# Patient Record
Sex: Female | Born: 1965 | Race: White | Hispanic: No | Marital: Married | Smoking: Never smoker
Health system: Southern US, Community
[De-identification: ages and names within clinical notes are randomized; demographics above are authoritative.]

## PROBLEM LIST (undated history)

## (undated) DIAGNOSIS — Z8489 Family history of other specified conditions: Secondary | ICD-10-CM

## (undated) DIAGNOSIS — F419 Anxiety disorder, unspecified: Secondary | ICD-10-CM

## (undated) DIAGNOSIS — I10 Essential (primary) hypertension: Secondary | ICD-10-CM

## (undated) DIAGNOSIS — Z9889 Other specified postprocedural states: Secondary | ICD-10-CM

## (undated) DIAGNOSIS — F32A Depression, unspecified: Secondary | ICD-10-CM

## (undated) DIAGNOSIS — D649 Anemia, unspecified: Secondary | ICD-10-CM

## (undated) DIAGNOSIS — J309 Allergic rhinitis, unspecified: Secondary | ICD-10-CM

## (undated) DIAGNOSIS — R519 Headache, unspecified: Secondary | ICD-10-CM

## (undated) DIAGNOSIS — G2581 Restless legs syndrome: Secondary | ICD-10-CM

## (undated) DIAGNOSIS — S83207A Unspecified tear of unspecified meniscus, current injury, left knee, initial encounter: Secondary | ICD-10-CM

## (undated) DIAGNOSIS — Z8719 Personal history of other diseases of the digestive system: Secondary | ICD-10-CM

## (undated) DIAGNOSIS — R112 Nausea with vomiting, unspecified: Secondary | ICD-10-CM

## (undated) DIAGNOSIS — E785 Hyperlipidemia, unspecified: Secondary | ICD-10-CM

## (undated) DIAGNOSIS — K219 Gastro-esophageal reflux disease without esophagitis: Secondary | ICD-10-CM

## (undated) DIAGNOSIS — K589 Irritable bowel syndrome without diarrhea: Secondary | ICD-10-CM

## (undated) DIAGNOSIS — K5790 Diverticulosis of intestine, part unspecified, without perforation or abscess without bleeding: Secondary | ICD-10-CM

## (undated) DIAGNOSIS — Z973 Presence of spectacles and contact lenses: Secondary | ICD-10-CM

## (undated) DIAGNOSIS — S83512A Sprain of anterior cruciate ligament of left knee, initial encounter: Secondary | ICD-10-CM

## (undated) DIAGNOSIS — J45909 Unspecified asthma, uncomplicated: Secondary | ICD-10-CM

## (undated) HISTORY — DX: Depression, unspecified: F32.A

## (undated) HISTORY — DX: Restless legs syndrome: G25.81

## (undated) HISTORY — DX: Hyperlipidemia, unspecified: E78.5

## (undated) HISTORY — PX: OTHER SURGICAL HISTORY: SHX169

## (undated) HISTORY — PX: TONSILLECTOMY: SUR1361

## (undated) HISTORY — DX: Diverticulosis of intestine, part unspecified, without perforation or abscess without bleeding: K57.90

## (undated) HISTORY — PX: COLONOSCOPY WITH ESOPHAGOGASTRODUODENOSCOPY (EGD): SHX5779

## (undated) HISTORY — DX: Anxiety disorder, unspecified: F41.9

## (undated) HISTORY — DX: Irritable bowel syndrome, unspecified: K58.9

## (undated) HISTORY — DX: Allergic rhinitis, unspecified: J30.9

---

## 1998-12-30 ENCOUNTER — Emergency Department (HOSPITAL_COMMUNITY): Admission: EM | Admit: 1998-12-30 | Discharge: 1998-12-30 | Payer: Self-pay | Admitting: Emergency Medicine

## 1998-12-30 ENCOUNTER — Encounter: Payer: Self-pay | Admitting: Emergency Medicine

## 2000-06-15 ENCOUNTER — Other Ambulatory Visit: Admission: RE | Admit: 2000-06-15 | Discharge: 2000-06-15 | Payer: Self-pay | Admitting: Family Medicine

## 2001-05-20 ENCOUNTER — Encounter: Admission: RE | Admit: 2001-05-20 | Discharge: 2001-05-20 | Payer: Self-pay | Admitting: Family Medicine

## 2001-05-20 ENCOUNTER — Encounter: Payer: Self-pay | Admitting: Family Medicine

## 2004-04-23 ENCOUNTER — Encounter: Admission: RE | Admit: 2004-04-23 | Discharge: 2004-04-23 | Payer: Self-pay | Admitting: Family Medicine

## 2004-07-03 ENCOUNTER — Other Ambulatory Visit: Admission: RE | Admit: 2004-07-03 | Discharge: 2004-07-03 | Payer: Self-pay | Admitting: Family Medicine

## 2006-06-15 ENCOUNTER — Other Ambulatory Visit: Admission: RE | Admit: 2006-06-15 | Discharge: 2006-06-15 | Payer: Self-pay | Admitting: Family Medicine

## 2007-05-20 ENCOUNTER — Ambulatory Visit (HOSPITAL_COMMUNITY): Admission: RE | Admit: 2007-05-20 | Discharge: 2007-05-20 | Payer: Self-pay | Admitting: Obstetrics and Gynecology

## 2007-05-20 ENCOUNTER — Encounter (INDEPENDENT_AMBULATORY_CARE_PROVIDER_SITE_OTHER): Payer: Self-pay | Admitting: Obstetrics and Gynecology

## 2009-09-07 ENCOUNTER — Encounter: Admission: RE | Admit: 2009-09-07 | Discharge: 2009-09-07 | Payer: Self-pay | Admitting: Family Medicine

## 2009-09-11 ENCOUNTER — Encounter: Admission: RE | Admit: 2009-09-11 | Discharge: 2009-09-11 | Payer: Self-pay | Admitting: Family Medicine

## 2010-08-06 NOTE — Op Note (Signed)
NAMELAYAN, ZALENSKI              ACCOUNT NO.:  [id number]   MEDICAL RECORD NO.:  [id number]          PATIENT TYPE:  AMB   LOCATION:  SDC                           FACILITY:  [facility]   PHYSICIAN:  Gerald Leitz, MD          DATE OF BIRTH:  Feb 24, 1966   DATE OF PROCEDURE:  05/20/2007  DATE OF DISCHARGE:                               OPERATIVE REPORT   PREOPERATIVE DIAGNOSIS:  Menorrhagia.   POSTOPERATIVE DIAGNOSIS:  Menorrhagia.   PROCEDURE:  Hysteroscopy, dilation and curettage, NovaSure endometrial  ablation.   SURGEON:  Gerald Leitz, M.D.   ASSISTANT:  None.   ANESTHESIA:  General.   FINDINGS:  Normal appearing endometrial cavity.  No masses noted.   SPECIMEN:  Endometrial curettings.   DISPOSITION OF SPECIMEN:  To pathology.   ESTIMATED BLOOD LOSS:  Minimal.   COMPLICATIONS:  None.   DISTENTION MEDIUM DEFICIT:  45 mL.   INDICATIONS:  45 year old with menorrhagia who has failed hormonal  therapy and desired treatment with endometrial ablation.   PROCEDURE IN DETAIL:  Informed consent was obtained. The patient was  taken to the operating room where she was placed under general  anesthesia.  She was prepped and draped in the usual sterile fashion.  A  bivalve speculum was placed into the vaginal vault.  The cervix was  grasped anteriorly with a single tooth tenaculum.  It was then sounded  to 8.5 cm.  The endocervical length was estimated at 3.5 cm.  The  hysteroscope was inserted.  The patient was noted to have clot in the  endometrial cavity.  No masses were appreciated. The hysteroscope was  removed and sharp curettage was performed. The cervix was dilated to 8  mm and then the  NovaSure ablation apparatus was set at a cavity length  of 5 cm.  It was then inserted into the endometrial cavity to a depth of  8.5 cm. The NovaSure array was deployed. Cavity assessment test was  performed and passed. The ablation was performed at a power of 107 for 1  minute and 40 seconds.   Once ablation was complete, the array was  withdrawn into the NovaSure apparatus and the NovaSure device was  removed. The hysteroscope was then reinserted.  The endometrial cavity  appeared adequately ablated.  There was no evidence of uterine  perforation.  A paracervical block for postop anesthesia was performed  with 10 mL of 0.25% Marcaine injected, 5 mL at the 5 o'clock and 7  o'clock positions.  The single tooth tenaculum was removed from the anterior lip of the  cervix. Excellent hemostasis was noted.  The patient was awakened from  anesthesia and taken to the recovery room awake and in stable condition.  Sponge, lap, and needle counts were correct x2.      Gerald Leitz, MD  Electronically Signed     TC/MEDQ  D:  05/20/2007  T:  05/20/2007  Job:  [id number]

## 2010-08-06 NOTE — H&P (Signed)
Paige Alvarez, Paige Alvarez              ACCOUNT NO.:  [id number]   MEDICAL RECORD NO.:  [id number]          PATIENT TYPE:  AMB   LOCATION:  SDC                           [facility]ILITY:  [facility]   PHYSICIAN:  Gerald Leitz, MD          DATE OF BIRTH:  20-Sep-1965   DATE OF ADMISSION:  04/14/2007  DATE OF DISCHARGE:  04/14/2007                              HISTORY & PHYSICAL   Patient scheduled for surgery on May 20, 2007.   HISTORY OF PRESENT ILLNESS:  This is a 45 year old G2, P2 with  menorrhagia uncontrolled with hormonal management.  Patient has been on  Provera with continued menstruation since March 29, 2007.   PAST OB HISTORY:  Spontaneous vaginal delivery x2.   GYN HISTORY:  Her husband has had a vasectomy.  Cycle interval was  irregular.  No history of sexually transmitted diseases.  Last Pap smear  March 2008, this was normal.  No history of abnormal Pap smears.   PAST MEDICAL HISTORY:  1. Seasonal allergies.  2. Hypertension.  3. Hypercholesterolemia.  4. Morbid obesity.   PAST SURGICAL HISTORY:  Tonsillectomy.   MEDICATIONS:  1. Singulair.  2. Allegra.  3. Lipitor.  4. Lisinopril.   ALLERGIES:  PERCOCET CAUSES GI UPSET.   SOCIAL HISTORY:  Patient is married.  She denies tobacco use.  Occasional alcohol use.  No illicit drug use.   FAMILY HISTORY:  Negative for breast, ovarian, and colon cancer.   REVIEW OF SYSTEMS:  Positive for chronic cough, otherwise negative as  stated in history of current illness.   PHYSICAL EXAM:  VITAL SIGNS:  Blood pressure 132/82.  Weight 230.5  pounds.  Height 64-3/4 inches.  CARDIOVASCULAR:  Regular rate and rhythm.  LUNGS:  Clear to auscultation bilaterally.  ABDOMEN:  Soft, nontender, and nondistended.  No masses palpated.  Normal external female genitalia.  No vulvar, vaginal  or cervical  lesions noted.  A small amount of blood in the vaginal vault.  Bimanual  exam, no masses.  No cervical motion tenderness.   Ultrasound performed  May 11, 2007, shows the uterus to measure 9.2  cm in length.  AP diameter is 5.0 cm, width is 5.7 cm.  Endometrial  thickness 12.9 cm.   IMPRESSION AND PLAN:  A 45 year old with menorrhagia, failed hormonal  therapy, desires intervention with hysteroscopy, D and C, and  endometrial ablation.  The risks, benefits, and alternatives of surgery  were discussed with the patient including but not limited to infection,  bleeding, possible uterine perforation with need for further surgery.  Patient voiced understanding and desires to proceed with hysteroscopy, D  and C, and endometrial ablation.      Gerald Leitz, MD  Electronically Signed     TC/MEDQ  D:  05/18/2007  T:  05/19/2007  Job:  [id number]

## 2010-09-26 ENCOUNTER — Other Ambulatory Visit: Payer: Self-pay | Admitting: Family Medicine

## 2010-09-26 DIAGNOSIS — Z1231 Encounter for screening mammogram for malignant neoplasm of breast: Secondary | ICD-10-CM

## 2010-10-03 ENCOUNTER — Ambulatory Visit: Payer: Self-pay

## 2010-10-08 ENCOUNTER — Ambulatory Visit
Admission: RE | Admit: 2010-10-08 | Discharge: 2010-10-08 | Disposition: A | Discharge status: Home / Self Care | Payer: BC Managed Care – PPO | Source: Ambulatory Visit | Attending: Family Medicine | Admitting: Family Medicine

## 2010-10-08 DIAGNOSIS — Z1231 Encounter for screening mammogram for malignant neoplasm of breast: Secondary | ICD-10-CM

## 2010-12-13 LAB — CBC
MCV: 85.1
Platelets: 308
RDW: 13.8
WBC: 6.8

## 2010-12-13 LAB — URINALYSIS, DIPSTICK ONLY
Nitrite: NEGATIVE
Specific Gravity, Urine: 1.01
Urobilinogen, UA: 0.2
pH: 6.5

## 2010-12-13 LAB — BASIC METABOLIC PANEL
BUN: 5 — ABNORMAL LOW
Chloride: 102
Creatinine, Ser: 0.61

## 2010-12-13 LAB — PREGNANCY, URINE: Preg Test, Ur: NEGATIVE

## 2011-05-01 ENCOUNTER — Ambulatory Visit: Payer: Self-pay | Admitting: Bariatrics

## 2011-05-01 LAB — CBC WITH DIFFERENTIAL/PLATELET
Basophil #: 0 10*3/uL (ref 0.0–0.1)
Basophil %: 0.4 %
Eosinophil #: 0.1 10*3/uL (ref 0.0–0.7)
HCT: 41.3 % (ref 35.0–47.0)
HGB: 13.8 g/dL (ref 12.0–16.0)
Lymphocyte #: 2.1 10*3/uL (ref 1.0–3.6)
Lymphocyte %: 32.7 %
MCHC: 33.3 g/dL (ref 32.0–36.0)
Monocyte #: 0.4 10*3/uL (ref 0.0–0.7)
Monocyte %: 6.3 %
Neutrophil %: 58.4 %
WBC: 6.3 10*3/uL (ref 3.6–11.0)

## 2011-05-01 LAB — PROTIME-INR: Prothrombin Time: 12.9 secs (ref 11.5–14.7)

## 2011-05-01 LAB — COMPREHENSIVE METABOLIC PANEL
Albumin: 3.9 g/dL (ref 3.4–5.0)
Alkaline Phosphatase: 58 U/L (ref 50–136)
BUN: 9 mg/dL (ref 7–18)
Bilirubin,Total: 0.6 mg/dL (ref 0.2–1.0)
Creatinine: 0.54 mg/dL — ABNORMAL LOW (ref 0.60–1.30)
Glucose: 96 mg/dL (ref 65–99)
Osmolality: 284 (ref 275–301)
Potassium: 3.4 mmol/L — ABNORMAL LOW (ref 3.5–5.1)
Sodium: 143 mmol/L (ref 136–145)
Total Protein: 7.2 g/dL (ref 6.4–8.2)

## 2011-05-01 LAB — LIPASE, BLOOD: Lipase: 105 U/L (ref 73–393)

## 2011-05-01 LAB — APTT: Activated PTT: 33.1 secs (ref 23.6–35.9)

## 2011-05-01 LAB — MAGNESIUM: Magnesium: 1.9 mg/dL

## 2011-05-01 LAB — TSH: Thyroid Stimulating Horm: 1.39 u[IU]/mL

## 2011-05-01 LAB — AMYLASE: Amylase: 34 U/L (ref 25–115)

## 2011-05-12 ENCOUNTER — Ambulatory Visit: Payer: Self-pay | Admitting: Bariatrics

## 2011-05-23 ENCOUNTER — Ambulatory Visit: Payer: Self-pay | Admitting: Bariatrics

## 2011-09-29 ENCOUNTER — Other Ambulatory Visit: Payer: Self-pay | Admitting: Physician Assistant

## 2011-09-29 ENCOUNTER — Other Ambulatory Visit (HOSPITAL_COMMUNITY)
Admission: RE | Admit: 2011-09-29 | Discharge: 2011-09-29 | Disposition: A | Discharge status: Home / Self Care | Payer: BC Managed Care – PPO | Source: Ambulatory Visit | Attending: Family Medicine | Admitting: Family Medicine

## 2011-09-29 ENCOUNTER — Other Ambulatory Visit: Payer: Self-pay | Admitting: Family Medicine

## 2011-09-29 DIAGNOSIS — Z1231 Encounter for screening mammogram for malignant neoplasm of breast: Secondary | ICD-10-CM

## 2011-09-29 DIAGNOSIS — Z124 Encounter for screening for malignant neoplasm of cervix: Secondary | ICD-10-CM | POA: Insufficient documentation

## 2011-10-16 ENCOUNTER — Ambulatory Visit
Admission: RE | Admit: 2011-10-16 | Discharge: 2011-10-16 | Disposition: A | Discharge status: Home / Self Care | Payer: BC Managed Care – PPO | Source: Ambulatory Visit | Attending: Family Medicine | Admitting: Family Medicine

## 2011-10-16 DIAGNOSIS — Z1231 Encounter for screening mammogram for malignant neoplasm of breast: Secondary | ICD-10-CM

## 2012-09-28 ENCOUNTER — Other Ambulatory Visit: Payer: Self-pay

## 2012-09-28 DIAGNOSIS — Z1231 Encounter for screening mammogram for malignant neoplasm of breast: Secondary | ICD-10-CM

## 2012-10-26 ENCOUNTER — Ambulatory Visit
Admission: RE | Admit: 2012-10-26 | Discharge: 2012-10-26 | Disposition: A | Discharge status: Home / Self Care | Payer: BC Managed Care – PPO | Source: Ambulatory Visit

## 2012-10-26 DIAGNOSIS — Z1231 Encounter for screening mammogram for malignant neoplasm of breast: Secondary | ICD-10-CM

## 2012-10-27 ENCOUNTER — Other Ambulatory Visit: Payer: Self-pay | Admitting: Family Medicine

## 2012-10-27 DIAGNOSIS — R928 Other abnormal and inconclusive findings on diagnostic imaging of breast: Secondary | ICD-10-CM

## 2012-11-11 ENCOUNTER — Ambulatory Visit
Admission: RE | Admit: 2012-11-11 | Discharge: 2012-11-11 | Disposition: A | Discharge status: Home / Self Care | Payer: BC Managed Care – PPO | Source: Ambulatory Visit | Attending: Family Medicine | Admitting: Family Medicine

## 2012-11-11 DIAGNOSIS — R928 Other abnormal and inconclusive findings on diagnostic imaging of breast: Secondary | ICD-10-CM

## 2013-10-17 ENCOUNTER — Other Ambulatory Visit: Payer: Self-pay

## 2013-10-17 DIAGNOSIS — Z1231 Encounter for screening mammogram for malignant neoplasm of breast: Secondary | ICD-10-CM

## 2013-10-27 ENCOUNTER — Ambulatory Visit
Admission: RE | Admit: 2013-10-27 | Discharge: 2013-10-27 | Disposition: A | Discharge status: Home / Self Care | Payer: BC Managed Care – PPO | Source: Ambulatory Visit

## 2013-10-27 DIAGNOSIS — Z1231 Encounter for screening mammogram for malignant neoplasm of breast: Secondary | ICD-10-CM

## 2013-12-30 ENCOUNTER — Other Ambulatory Visit: Payer: Self-pay | Admitting: Family Medicine

## 2013-12-30 DIAGNOSIS — R1011 Right upper quadrant pain: Secondary | ICD-10-CM

## 2014-01-03 ENCOUNTER — Ambulatory Visit (HOSPITAL_COMMUNITY)
Admission: RE | Admit: 2014-01-03 | Discharge: 2014-01-03 | Disposition: A | Discharge status: Home / Self Care | Payer: BC Managed Care – PPO | Source: Ambulatory Visit | Attending: Family Medicine | Admitting: Family Medicine

## 2014-01-03 DIAGNOSIS — R110 Nausea: Secondary | ICD-10-CM | POA: Diagnosis not present

## 2014-01-03 DIAGNOSIS — R1011 Right upper quadrant pain: Secondary | ICD-10-CM | POA: Diagnosis present

## 2014-01-06 ENCOUNTER — Other Ambulatory Visit (INDEPENDENT_AMBULATORY_CARE_PROVIDER_SITE_OTHER): Payer: Self-pay | Admitting: General Surgery

## 2014-01-06 ENCOUNTER — Other Ambulatory Visit: Payer: BC Managed Care – PPO

## 2014-01-10 NOTE — Patient Instructions (Addendum)
Sharia ReeveDeborah A Stai  01/10/2014   Your procedure is scheduled on:01/12/2014      Come thru the [facility] Entrance.    Follow the Signs to Short Stay Center at   0754     am  Call this number if you have problems the morning of surgery: [phone]   Remember:   Do not eat food or drink liquids after midnight.   Take these medicines the morning of surgery with A SIP OF WATER: Ventolin Inhaler if needed, Norvasc, Allegra, Flovent Inhaler if needed, Singulair , Prilosec   Do not wear jewelry, make-up or nail polish.  Do not wear lotions, powders, or perfumes.  deodorant.  Do not shave 48 hours prior to surgery.   Do not bring valuables to the hospital.  Contacts, dentures or bridgework may not be worn into surgery.  .     Patients discharged the day of surgery will not be allowed to drive  home.  Name and phone number of your driver:      Please read over the following fact sheets that you were given: [facility] Pre[facility] Before surgery, you can play an important role.  Because skin is not sterile, your skin needs to be as free of germs as possible.  You can reduce the number of germs on your skin by washing with CHG (chlorahexidine gluconate) soap before surgery.  CHG is an antiseptic cleaner which kills germs and bonds with the skin to continue killing germs even after washing. Please DO NOT use if you have an allergy to CHG or antibacterial soaps.  If your skin becomes reddened/irritated stop using the CHG and inform your nurse when you arrive at Short Stay. Do not shave (including legs and underarms) for at least 48 hours prior to the first CHG shower.  You may shave your face/neck. Please follow these instructions carefully:  1.  Shower with CHG Soap the night before surgery and the  morning of Surgery.  2.  If you choose to wash your hair, wash your hair first as usual with your  normal  shampoo.  3.  After you shampoo, rinse your hair and body thoroughly to remove the   shampoo.                           4.  Use CHG as you would any other liquid soap.  You can apply chg directly  to the skin and wash                       Gently with a scrungie or clean washcloth.  5.  Apply the CHG Soap to your body ONLY FROM THE NECK DOWN.   Do not use on face/ open                           Wound or open sores. Avoid contact with eyes, ears mouth and genitals (private parts).                       Wash face,  Genitals (private parts) with your normal soap.             6.  Wash thoroughly, paying special attention to the area where your surgery  will be performed.  7.  Thoroughly rinse your body with warm water from the neck down.  8.  DO NOT shower/wash with your normal soap after using and rinsing off  the CHG Soap.                9.  Pat yourself dry with a clean towel.            10.  Wear clean pajamas.            11.  Place clean sheets on your bed the night of your first shower and do not  sleep with pets. Day of Surgery : Do not apply any lotions/deodorants the morning of surgery.  Please wear clean clothes to the hospital/surgery center.  FAILURE TO FOLLOW THESE INSTRUCTIONS MAY RESULT IN THE CANCELLATION OF YOUR SURGERY PATIENT SIGNATURE_________________________________  NURSE SIGNATURE__________________________________  ________________________________________________________________________  coughing and deep breathing exercises, leg exercises

## 2014-01-11 ENCOUNTER — Encounter (HOSPITAL_COMMUNITY)
Admission: RE | Admit: 2014-01-11 | Discharge: 2014-01-11 | Disposition: A | Discharge status: Home / Self Care | Payer: BC Managed Care – PPO | Source: Ambulatory Visit | Attending: General Surgery | Admitting: General Surgery

## 2014-01-11 ENCOUNTER — Encounter (HOSPITAL_COMMUNITY): Payer: Self-pay | Admitting: Pharmacy Technician

## 2014-01-11 ENCOUNTER — Encounter (HOSPITAL_COMMUNITY): Payer: Self-pay

## 2014-01-11 ENCOUNTER — Ambulatory Visit (HOSPITAL_COMMUNITY)
Admission: RE | Admit: 2014-01-11 | Discharge: 2014-01-11 | Disposition: A | Discharge status: Home / Self Care | Payer: BC Managed Care – PPO | Source: Ambulatory Visit | Attending: General Surgery | Admitting: General Surgery

## 2014-01-11 DIAGNOSIS — J45909 Unspecified asthma, uncomplicated: Secondary | ICD-10-CM | POA: Diagnosis not present

## 2014-01-11 DIAGNOSIS — K8020 Calculus of gallbladder without cholecystitis without obstruction: Secondary | ICD-10-CM | POA: Diagnosis present

## 2014-01-11 DIAGNOSIS — K8010 Calculus of gallbladder with chronic cholecystitis without obstruction: Secondary | ICD-10-CM | POA: Diagnosis not present

## 2014-01-11 DIAGNOSIS — K219 Gastro-esophageal reflux disease without esophagitis: Secondary | ICD-10-CM | POA: Diagnosis not present

## 2014-01-11 DIAGNOSIS — K660 Peritoneal adhesions (postprocedural) (postinfection): Secondary | ICD-10-CM | POA: Diagnosis not present

## 2014-01-11 DIAGNOSIS — I10 Essential (primary) hypertension: Secondary | ICD-10-CM | POA: Diagnosis not present

## 2014-01-11 HISTORY — DX: Personal history of other diseases of the digestive system: Z87.19

## 2014-01-11 HISTORY — DX: Other specified postprocedural states: R11.2

## 2014-01-11 HISTORY — DX: Anemia, unspecified: D64.9

## 2014-01-11 HISTORY — DX: Unspecified asthma, uncomplicated: J45.909

## 2014-01-11 HISTORY — DX: Essential (primary) hypertension: I10

## 2014-01-11 HISTORY — DX: Other specified postprocedural states: Z98.890

## 2014-01-11 LAB — CBC
HEMATOCRIT: 42.9 % (ref 36.0–46.0)
Hemoglobin: 14.5 g/dL (ref 12.0–15.0)
MCH: 29.1 pg (ref 26.0–34.0)
MCHC: 33.8 g/dL (ref 30.0–36.0)
MCV: 86.1 fL (ref 78.0–100.0)
Platelets: 304 10*3/uL (ref 150–400)
RBC: 4.98 MIL/uL (ref 3.87–5.11)
RDW: 13.2 % (ref 11.5–15.5)
WBC: 7.4 10*3/uL (ref 4.0–10.5)

## 2014-01-11 LAB — BASIC METABOLIC PANEL
Anion gap: 14 (ref 5–15)
BUN: 7 mg/dL (ref 6–23)
CALCIUM: 9.3 mg/dL (ref 8.4–10.5)
CO2: 25 mEq/L (ref 19–32)
CREATININE: 0.5 mg/dL (ref 0.50–1.10)
Chloride: 105 mEq/L (ref 96–112)
GFR calc Af Amer: >90 mL/min (ref >90)
GFR calc non Af Amer: >90 mL/min (ref >90)
GLUCOSE: 146 mg/dL — AB (ref 70–99)
Potassium: 3.5 mEq/L — ABNORMAL LOW (ref 3.7–5.3)
SODIUM: 144 meq/L (ref 137–147)

## 2014-01-11 NOTE — Progress Notes (Signed)
Patient complained of constipation today and preop appointment.  Called patient after she had left preop appointment and told her to call Dr Johna SheriffHoxworth office and to let the nurse know and they would be able to prescribe medication for the constipation prior to surgery  Patient voiced understanding.  Patient given phone number to call of [phone].

## 2014-01-11 NOTE — Progress Notes (Signed)
BMP results faxed via EPIC to Dr Johna SheriffHoxworth.

## 2014-01-11 NOTE — Progress Notes (Signed)
Called [facility] on [address] to verify dose of Zofran patient is taking.  Placed into EPIc.

## 2014-01-11 NOTE — Progress Notes (Signed)
Requested ekg from office of [place] , [place]  They will fax.

## 2014-01-12 ENCOUNTER — Encounter (HOSPITAL_COMMUNITY)
Admission: RE | Disposition: A | Discharge status: Home / Self Care | Payer: Self-pay | Source: Ambulatory Visit | Attending: General Surgery

## 2014-01-12 ENCOUNTER — Ambulatory Visit (HOSPITAL_COMMUNITY): Payer: BC Managed Care – PPO | Admitting: Registered Nurse

## 2014-01-12 ENCOUNTER — Ambulatory Visit (HOSPITAL_COMMUNITY): Payer: BC Managed Care – PPO

## 2014-01-12 ENCOUNTER — Encounter (HOSPITAL_COMMUNITY): Payer: BC Managed Care – PPO | Admitting: Registered Nurse

## 2014-01-12 ENCOUNTER — Encounter (HOSPITAL_COMMUNITY): Payer: Self-pay | Admitting: *Deleted

## 2014-01-12 ENCOUNTER — Ambulatory Visit (HOSPITAL_COMMUNITY)
Admission: RE | Admit: 2014-01-12 | Discharge: 2014-01-12 | Disposition: A | Discharge status: Home / Self Care | Payer: BC Managed Care – PPO | Source: Ambulatory Visit | Attending: General Surgery | Admitting: General Surgery

## 2014-01-12 DIAGNOSIS — K8020 Calculus of gallbladder without cholecystitis without obstruction: Secondary | ICD-10-CM

## 2014-01-12 DIAGNOSIS — K219 Gastro-esophageal reflux disease without esophagitis: Secondary | ICD-10-CM | POA: Insufficient documentation

## 2014-01-12 DIAGNOSIS — J45909 Unspecified asthma, uncomplicated: Secondary | ICD-10-CM | POA: Insufficient documentation

## 2014-01-12 DIAGNOSIS — I10 Essential (primary) hypertension: Secondary | ICD-10-CM | POA: Insufficient documentation

## 2014-01-12 DIAGNOSIS — K8010 Calculus of gallbladder with chronic cholecystitis without obstruction: Secondary | ICD-10-CM | POA: Diagnosis not present

## 2014-01-12 DIAGNOSIS — K660 Peritoneal adhesions (postprocedural) (postinfection): Secondary | ICD-10-CM | POA: Insufficient documentation

## 2014-01-12 HISTORY — PX: CHOLECYSTECTOMY: SHX55

## 2014-01-12 SURGERY — LAPAROSCOPIC CHOLECYSTECTOMY WITH INTRAOPERATIVE CHOLANGIOGRAM
Anesthesia: General | Site: Abdomen

## 2014-01-12 MED ORDER — LACTATED RINGERS IV SOLN
INTRAVENOUS | Status: DC
  Administered 2014-01-12 (×2): via INTRAVENOUS

## 2014-01-12 MED ORDER — GLYCOPYRROLATE 0.2 MG/ML IJ SOLN
INTRAMUSCULAR | Status: DC | PRN
  Administered 2014-01-12: 0.4 mg via INTRAVENOUS

## 2014-01-12 MED ORDER — HYDROMORPHONE HCL 1 MG/ML IJ SOLN
INTRAMUSCULAR | Status: AC
  Filled 2014-01-12: qty 1

## 2014-01-12 MED ORDER — PROMETHAZINE HCL 25 MG/ML IJ SOLN
6.2500 mg | INTRAMUSCULAR | Status: AC | PRN
  Administered 2014-01-12 (×2): 6.25 mg via INTRAVENOUS

## 2014-01-12 MED ORDER — CEFAZOLIN SODIUM-DEXTROSE 2-3 GM-% IV SOLR
2.0000 g | INTRAVENOUS | Status: AC
  Administered 2014-01-12: 2 g via INTRAVENOUS

## 2014-01-12 MED ORDER — 0.9 % SODIUM CHLORIDE (POUR BTL) OPTIME
TOPICAL | Status: DC | PRN
  Administered 2014-01-12: 1000 mL

## 2014-01-12 MED ORDER — PROMETHAZINE HCL 25 MG/ML IJ SOLN
INTRAMUSCULAR | Status: AC
  Filled 2014-01-12: qty 1

## 2014-01-12 MED ORDER — MIDAZOLAM HCL 5 MG/5ML IJ SOLN
INTRAMUSCULAR | Status: DC | PRN
  Administered 2014-01-12: 2 mg via INTRAVENOUS

## 2014-01-12 MED ORDER — ROCURONIUM BROMIDE 100 MG/10ML IV SOLN
INTRAVENOUS | Status: AC
  Filled 2014-01-12: qty 1

## 2014-01-12 MED ORDER — MIDAZOLAM HCL 2 MG/2ML IJ SOLN
INTRAMUSCULAR | Status: AC
  Filled 2014-01-12: qty 2

## 2014-01-12 MED ORDER — KETOROLAC TROMETHAMINE 30 MG/ML IJ SOLN
INTRAMUSCULAR | Status: DC | PRN
  Administered 2014-01-12: 30 mg via INTRAVENOUS

## 2014-01-12 MED ORDER — ONDANSETRON HCL 4 MG/2ML IJ SOLN
INTRAMUSCULAR | Status: AC
  Filled 2014-01-12: qty 2

## 2014-01-12 MED ORDER — HYDROCODONE-ACETAMINOPHEN 5-325 MG PO TABS: 1.0000 | ORAL_TABLET | ORAL | Status: DC | PRN

## 2014-01-12 MED ORDER — LIDOCAINE HCL (CARDIAC) 20 MG/ML IV SOLN
INTRAVENOUS | Status: DC | PRN
  Administered 2014-01-12: 75 mg via INTRAVENOUS
  Administered 2014-01-12: 25 mg via INTRATRACHEAL

## 2014-01-12 MED ORDER — FENTANYL CITRATE 0.05 MG/ML IJ SOLN
INTRAMUSCULAR | Status: AC
  Filled 2014-01-12: qty 5

## 2014-01-12 MED ORDER — GLYCOPYRROLATE 0.2 MG/ML IJ SOLN
INTRAMUSCULAR | Status: AC
  Filled 2014-01-12: qty 3

## 2014-01-12 MED ORDER — CHLORHEXIDINE GLUCONATE 4 % EX LIQD: 1.0000 "application " | Freq: Once | CUTANEOUS | Status: DC

## 2014-01-12 MED ORDER — BUPIVACAINE-EPINEPHRINE 0.25% -1:200000 IJ SOLN
INTRAMUSCULAR | Status: DC | PRN
  Administered 2014-01-12: 25 mL

## 2014-01-12 MED ORDER — HYDROCODONE-ACETAMINOPHEN 5-325 MG PO TABS
1.0000 | ORAL_TABLET | ORAL | Status: DC | PRN
  Administered 2014-01-12: 1 via ORAL
  Filled 2014-01-12: qty 1

## 2014-01-12 MED ORDER — SCOPOLAMINE 1 MG/3DAYS TD PT72
MEDICATED_PATCH | TRANSDERMAL | Status: AC
  Filled 2014-01-12: qty 1

## 2014-01-12 MED ORDER — DEXAMETHASONE SODIUM PHOSPHATE 10 MG/ML IJ SOLN
INTRAMUSCULAR | Status: AC
  Filled 2014-01-12: qty 1

## 2014-01-12 MED ORDER — SCOPOLAMINE 1 MG/3DAYS TD PT72
1.0000 | MEDICATED_PATCH | TRANSDERMAL | Status: AC
  Administered 2014-01-12: 1 via TRANSDERMAL
  Filled 2014-01-12: qty 1

## 2014-01-12 MED ORDER — METOCLOPRAMIDE HCL 5 MG/ML IJ SOLN
INTRAMUSCULAR | Status: AC
  Filled 2014-01-12: qty 2

## 2014-01-12 MED ORDER — SUFENTANIL CITRATE 50 MCG/ML IV SOLN
INTRAVENOUS | Status: DC | PRN
  Administered 2014-01-12: 15 ug via INTRAVENOUS
  Administered 2014-01-12: 10 ug via INTRAVENOUS
  Administered 2014-01-12: 15 ug via INTRAVENOUS
  Administered 2014-01-12: 10 ug via INTRAVENOUS

## 2014-01-12 MED ORDER — LACTATED RINGERS IV SOLN: INTRAVENOUS | Status: DC

## 2014-01-12 MED ORDER — NEOSTIGMINE METHYLSULFATE 10 MG/10ML IV SOLN
INTRAVENOUS | Status: AC
  Filled 2014-01-12: qty 1

## 2014-01-12 MED ORDER — HYDROMORPHONE HCL 1 MG/ML IJ SOLN
0.2500 mg | INTRAMUSCULAR | Status: DC | PRN
  Administered 2014-01-12: 0.5 mg via INTRAVENOUS

## 2014-01-12 MED ORDER — ONDANSETRON HCL 4 MG/2ML IJ SOLN
INTRAMUSCULAR | Status: DC | PRN
  Administered 2014-01-12 (×2): 4 mg via INTRAVENOUS

## 2014-01-12 MED ORDER — SUFENTANIL CITRATE 50 MCG/ML IV SOLN
INTRAVENOUS | Status: AC
  Filled 2014-01-12: qty 1

## 2014-01-12 MED ORDER — BUPIVACAINE-EPINEPHRINE (PF) 0.25% -1:200000 IJ SOLN
INTRAMUSCULAR | Status: AC
Start: 2014-01-12 — End: 2014-01-12
  Filled 2014-01-12: qty 30

## 2014-01-12 MED ORDER — SUCCINYLCHOLINE CHLORIDE 20 MG/ML IJ SOLN
INTRAMUSCULAR | Status: DC | PRN
  Administered 2014-01-12: 100 mg via INTRAVENOUS

## 2014-01-12 MED ORDER — LACTATED RINGERS IR SOLN
Status: DC | PRN
  Administered 2014-01-12: 1000 mL

## 2014-01-12 MED ORDER — CEFAZOLIN SODIUM-DEXTROSE 2-3 GM-% IV SOLR
INTRAVENOUS | Status: AC
  Filled 2014-01-12: qty 50

## 2014-01-12 MED ORDER — SODIUM CHLORIDE 0.9 % IJ SOLN
INTRAMUSCULAR | Status: AC
  Filled 2014-01-12: qty 10

## 2014-01-12 MED ORDER — NEOSTIGMINE METHYLSULFATE 10 MG/10ML IV SOLN
INTRAVENOUS | Status: DC | PRN
  Administered 2014-01-12: 3 mg via INTRAVENOUS

## 2014-01-12 MED ORDER — ONDANSETRON HCL 4 MG PO TABS: 4.0000 mg | ORAL_TABLET | Freq: Three times a day (TID) | ORAL | Status: DC | PRN

## 2014-01-12 MED ORDER — LIDOCAINE HCL (CARDIAC) 20 MG/ML IV SOLN
INTRAVENOUS | Status: AC
  Filled 2014-01-12: qty 5

## 2014-01-12 MED ORDER — ROCURONIUM BROMIDE 100 MG/10ML IV SOLN
INTRAVENOUS | Status: DC | PRN
  Administered 2014-01-12: 5 mg via INTRAVENOUS
  Administered 2014-01-12: 35 mg via INTRAVENOUS

## 2014-01-12 MED ORDER — FENTANYL CITRATE 0.05 MG/ML IJ SOLN
INTRAMUSCULAR | Status: DC | PRN
  Administered 2014-01-12: 50 ug via INTRAVENOUS

## 2014-01-12 MED ORDER — PROPOFOL 10 MG/ML IV BOLUS
INTRAVENOUS | Status: AC
  Filled 2014-01-12: qty 20

## 2014-01-12 MED ORDER — METOCLOPRAMIDE HCL 5 MG/ML IJ SOLN
INTRAMUSCULAR | Status: DC | PRN
  Administered 2014-01-12: 10 mg via INTRAVENOUS

## 2014-01-12 MED ORDER — PROPOFOL 10 MG/ML IV BOLUS
INTRAVENOUS | Status: DC | PRN
  Administered 2014-01-12: 200 mg via INTRAVENOUS

## 2014-01-12 MED ORDER — DEXAMETHASONE SODIUM PHOSPHATE 10 MG/ML IJ SOLN
INTRAMUSCULAR | Status: DC | PRN
  Administered 2014-01-12: 10 mg via INTRAVENOUS

## 2014-01-12 MED ORDER — KETOROLAC TROMETHAMINE 30 MG/ML IJ SOLN
INTRAMUSCULAR | Status: AC
  Filled 2014-01-12: qty 1

## 2014-01-12 SURGICAL SUPPLY — 39 items
ADH SKN CLS APL DERMABOND .7 (GAUZE/BANDAGES/DRESSINGS) ×1
APPLIER CLIP ROT 10 11.4 M/L (STAPLE) ×3
APR CLP MED LRG 11.4X10 (STAPLE) ×1
BAG SPEC RTRVL LRG 6X4 10 (ENDOMECHANICALS)
CANISTER SUCTION 2500CC (MISCELLANEOUS) ×3 IMPLANT
CATH REDDICK CHOLANGI 4FR 50CM (CATHETERS) IMPLANT
CHLORAPREP W/TINT 26ML (MISCELLANEOUS) ×3 IMPLANT
CLIP APPLIE ROT 10 11.4 M/L (STAPLE) ×1 IMPLANT
COVER MAYO STAND STRL (DRAPES) ×3 IMPLANT
DECANTER SPIKE VIAL GLASS SM (MISCELLANEOUS) ×3 IMPLANT
DERMABOND ADVANCED (GAUZE/BANDAGES/DRESSINGS) ×2
DERMABOND ADVANCED .7 DNX12 (GAUZE/BANDAGES/DRESSINGS) ×1 IMPLANT
DRAPE C-ARM 42X120 X-RAY (DRAPES) ×3 IMPLANT
DRAPE LAPAROSCOPIC ABDOMINAL (DRAPES) ×3 IMPLANT
DRAPE UTILITY XL STRL (DRAPES) ×3 IMPLANT
ELECT REM PT RETURN 9FT ADLT (ELECTROSURGICAL) ×3
ELECTRODE REM PT RTRN 9FT ADLT (ELECTROSURGICAL) ×1 IMPLANT
GLOVE BIOGEL PI IND STRL 7.5 (GLOVE) ×1 IMPLANT
GLOVE BIOGEL PI INDICATOR 7.5 (GLOVE) ×2
GLOVE SS BIOGEL STRL SZ 7.5 (GLOVE) ×1 IMPLANT
GLOVE SUPERSENSE BIOGEL SZ 7.5 (GLOVE) ×2
GOWN STRL REUS W/TWL XL LVL3 (GOWN DISPOSABLE) ×6 IMPLANT
HEMOSTAT SNOW SURGICEL 2X4 (HEMOSTASIS) IMPLANT
KIT BASIN OR (CUSTOM PROCEDURE TRAY) ×3 IMPLANT
NS IRRIG 1000ML POUR BTL (IV SOLUTION) IMPLANT
POUCH SPECIMEN RETRIEVAL 10MM (ENDOMECHANICALS) IMPLANT
SCISSORS LAP 5X35 DISP (ENDOMECHANICALS) ×3 IMPLANT
SET CHOLANGIOGRAPH MIX (MISCELLANEOUS) ×3 IMPLANT
SET IRRIG TUBING LAPAROSCOPIC (IRRIGATION / IRRIGATOR) ×3 IMPLANT
SLEEVE XCEL OPT CAN 5 100 (ENDOMECHANICALS) ×3 IMPLANT
SOLUTION ANTI FOG 6CC (MISCELLANEOUS) ×3 IMPLANT
SUT MNCRL AB 4-0 PS2 18 (SUTURE) ×3 IMPLANT
TOWEL OR 17X26 10 PK STRL BLUE (TOWEL DISPOSABLE) ×3 IMPLANT
TOWEL OR NON WOVEN STRL DISP B (DISPOSABLE) ×3 IMPLANT
TRAY LAPAROSCOPIC (CUSTOM PROCEDURE TRAY) ×3 IMPLANT
TROCAR BLADELESS OPT 5 100 (ENDOMECHANICALS) ×3 IMPLANT
TROCAR XCEL BLUNT TIP 100MML (ENDOMECHANICALS) ×3 IMPLANT
TROCAR XCEL NON-BLD 11X100MML (ENDOMECHANICALS) ×3 IMPLANT
TUBING INSUFFLATION 10FT LAP (TUBING) ×3 IMPLANT

## 2014-01-12 NOTE — Anesthesia Preprocedure Evaluation (Signed)
Anesthesia Evaluation  Patient identified by MRN, date of birth, ID band Patient awake    Reviewed: Allergy & Precautions, H&P , NPO status , Patient's Chart, lab work & pertinent test results  History of Anesthesia Complications (+) PONV  Airway Mallampati: II TM Distance: >3 FB Neck ROM: full    Dental no notable dental hx. (+) Teeth Intact, Dental Advisory Given   Pulmonary neg pulmonary ROS, asthma ,  Moderate asthma breath sounds clear to auscultation  Pulmonary exam normal       Cardiovascular Exercise Tolerance: Good hypertension, Pt. on medications Rhythm:regular Rate:Normal     Neuro/Psych negative neurological ROS  negative psych ROS   GI/Hepatic negative GI ROS, Neg liver ROS, GERD-  Medicated and Controlled,  Endo/Other  negative endocrine ROS  Renal/GU negative Renal ROS  negative genitourinary   Musculoskeletal   Abdominal   Peds  Hematology negative hematology ROS (+)   Anesthesia Other Findings   Reproductive/Obstetrics negative OB ROS                           Anesthesia Physical Anesthesia Plan  ASA: II  Anesthesia Plan: General   Post-op Pain Management:    Induction: Intravenous  Airway Management Planned: Oral ETT  Additional Equipment:   Intra-op Plan:   Post-operative Plan: Extubation in OR  Informed Consent: I have reviewed the patients History and Physical, chart, labs and discussed the procedure including the risks, benefits and alternatives for the proposed anesthesia with the patient or authorized representative who has indicated his/her understanding and acceptance.   Dental Advisory Given  Plan Discussed with: CRNA and Surgeon  Anesthesia Plan Comments:         Anesthesia Quick Evaluation

## 2014-01-12 NOTE — Op Note (Signed)
Preoperative Diagnosis: gallstones  Postoprative Diagnosis: gallstones  Procedure: Procedure(s): LAPAROSCOPIC CHOLECYSTECTOMY   Surgeon: Glenna FellowsHoxworth, Jamarri Vuncannon T   Assistants: None  Anesthesia:  General endotracheal anesthesia  Indications: Patient is a 48 year old female with a previous history of sleeve gastrectomy. She has had significant weight loss and then weight regain. She presents with persistent and worsening episodic right upper quadrant abdominal pain ultrasound has shown multiple small gallstones. I recommended proceeding with laparoscopic cholecystectomy with cholangiogram. We discussed the indications for the surgery and risks detailed elsewhere and she agrees to proceed  Procedure Detail:  Patient was brought to the operating room, placed in the supine position on the operating table, endotracheal anesthesia induced. She received preoperative IV antibiotics. PAS were in place. She received subcutaneous heparin. Patient timeout was performed and correct procedure verified. Access was obtained with an incision in the midline 1 cm in length above the umbilicus due to obesity with an open Hassan technique through a mattress suture of 0 Vicryl and pneumoperitoneum established. Under direct vision an 11 mm trocar was placed subxiphoid and 25 mm trochars along the right subcostal margin. The liver was quite enlarged but nonnodular. The gallbladder had some omental adhesions but was not acutely or severely chronically inflamed. The fundus was grasped and elevated up over the liver. A few chronic omental effusions were taken down with cautery and the infundibulum exposed and retracted inferolaterally. The fibrofatty tissue was stripped off of the gallbladder toward the porta hepatis and peritoneum anterior andposterior at Calot's triangle was incised. The distal gallbladder and Calot's triangle was thoroughly dissected. The cystic artery was isolated and clearly seen coursing up on the gallbladder  wall and was divided between 2 proximal and one distal clip. The gallbladder cystic duct junction was dissected free and 60 and the cystic duct dissected out over about a centimeter and a half. When the anatomy was clear the cystic duct was clipped the gallbladder junction. I attempted a cholangiogram but I could not get the catheter to thread cleanly through the cystic duct. I did milk a small stone out of the cystic duct of the catheter would still not thread likely secondary to a valve. I therefore elected to forego the cholangiogram and the cystic duct was triply clipped proximally and divided. The gallbladder was dissected free from its bed using hook cautery and placed in an Endo Catch bag and brought out through the supraumbilical incision. The right upper quadrant was thoroughly urinated until clear. There was no bleeding or any evidence of injury. All CO2 evacuated trochars removed. The mattress The suture was secured at the supraumbilical incision. Skin incisions were closed with subcuticular Monocryl and Dermabond. Sponge needle and instrument counts were correct.    Findings: As above  Estimated Blood Loss:  Minimal         Drains: none  Blood Given: none          Specimens: Gallbladder and contents        Complications:  * No complications entered in OR log *         Disposition: PACU - hemodynamically stable.         Condition: stable

## 2014-01-12 NOTE — Anesthesia Postprocedure Evaluation (Signed)
  Anesthesia Post-op Note  Patient: Paige Alvarez  Procedure(s) Performed: Procedure(s) (LRB): LAPAROSCOPIC CHOLECYSTECTOMY (N/A)  Patient Location: PACU  Anesthesia Type: General  Level of Consciousness: awake and alert   Airway and Oxygen Therapy: Patient Spontanous Breathing  Post-op Pain: mild  Post-op Assessment: Post-op Vital signs reviewed, Patient's Cardiovascular Status Stable, Respiratory Function Stable, Patent Airway and No signs of Nausea or vomiting  Last Vitals:  Filed Vitals:   01/12/14 1200  BP: 121/56  Pulse: 58  Temp:   Resp: 13    Post-op Vital Signs: stable   Complications: No apparent anesthesia complications

## 2014-01-12 NOTE — Progress Notes (Signed)
Patient up to recliner chair after gallbladder surgery. Tolerated well.

## 2014-01-12 NOTE — Discharge Instructions (Signed)
[facility] ______[facility] °LAPAROSCOPIC SURGERY: POST OP INSTRUCTIONS °Always review your discharge instruction sheet given to you by the facility where your surgery was performed. °IF YOU HAVE DISABILITY OR FAMILY LEAVE FORMS, YOU MUST BRING THEM TO THE OFFICE FOR PROCESSING.   °DO NOT GIVE THEM TO YOUR DOCTOR. ° °1. A prescription for pain medication may be given to you upon discharge.  Take your pain medication as prescribed, if needed.  If narcotic pain medicine is not needed, then you may take acetaminophen (Tylenol) or ibuprofen (Advil) as needed. °2. Take your usually prescribed medications unless otherwise directed. °3. If you need a refill on your pain medication, please contact your pharmacy.  They will contact our office to request authorization. Prescriptions will not be filled after 5pm or on week-ends. °4. You should follow a light diet the first few days after arrival home, such as soup and crackers, etc.  Be sure to include lots of fluids daily. °5. Most patients will experience some swelling and bruising in the area of the incisions.  Ice packs will help.  Swelling and bruising can take several days to resolve.  °6. It is common to experience some constipation if taking pain medication after surgery.  Increasing fluid intake and taking a stool softener (such as Colace) will usually help or prevent this problem from occurring.  A mild laxative (Milk of Magnesia or Miralax) should be taken according to package instructions if there are no bowel movements after 48 hours. °7. Unless discharge instructions indicate otherwise, you may remove your bandages 24-48 hours after surgery, and you may shower at that time.  You may have steri-strips (small skin tapes) in place directly over the incision.  These strips should be left on the skin for 7-10 days.  If your surgeon used skin glue on the incision, you may shower in 24 hours.  The glue will flake off over the next 2-3 weeks.  Any sutures or  staples will be removed at the office during your follow-up visit. °8. ACTIVITIES:  You may resume regular (light) daily activities beginning the next day--such as daily self-care, walking, climbing stairs--gradually increasing activities as tolerated.  You may have sexual intercourse when it is comfortable.  Refrain from any heavy lifting or straining until approved by your doctor. °a. You may drive when you are no longer taking prescription pain medication, you can comfortably wear a seatbelt, and you can safely maneuver your car and apply brakes. °b. RETURN TO WORK:  __________________________________________________________ °9. You should see your doctor in the office for a follow-up appointment approximately 2-3 weeks after your surgery.  Make sure that you call for this appointment within a day or two after you arrive home to insure a convenient appointment time. °10. OTHER INSTRUCTIONS: __________________________________________________________________________________________________________________________ __________________________________________________________________________________________________________________________ °WHEN TO CALL YOUR DOCTOR: °1. Fever over 101.0 °2. Inability to urinate °3. Continued bleeding from incision. °4. Increased pain, redness, or drainage from the incision. °5. Increasing abdominal pain ° °The clinic staff is available to answer your questions during regular business hours.  Please don’t hesitate to call and ask to speak to one of the nurses for clinical concerns.  If you have a medical emergency, go to the nearest emergency room or call 911.  A surgeon from [facility] is always on call at the hospital. °[address], [address], [place], [place]  [address] ? [address] [address], [place],    [address] °[phone] ? [phone] ? FAX [phone] °Web site:   [email or web address] °

## 2014-01-12 NOTE — H&P (Signed)
  History of Present Illness Paige Alvarez(Jayshun Galentine T. Loree Shehata MD; 01/06/2014 5:07 PM) Patient words: gallbaldder.  The patient is a 48 year old female who presents for evaluation of gall stones. She is referred by Dr. Blair Heysobert Ehinger for ongoing GI complaints and gallstones. She states that her most recent episode started about 2 weeks ago. She describes intermittent epigastric and right upper quadrant pain often radiating to her back. This feels like cramps or sharp pain. This has been associated with some nausea and lack of appetite. It has been intermittent and not necessarily related to eating. She is feeling a little bit better than a couple of weeks ago but not completely well but still lack of appetite and mild discomfort. She states that thinking back she has probably been having similar less severe episodes for about one year. No fever chills or jaundice. 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   Allergies (Sonya Bynum, CMA; 01/06/2014 4:12 PM) No Known Drug Allergies10/16/2015  Medication History (Sonya Bynum, CMA; 01/06/2014 4:13 PM) AmLODIPine Besylate (5MG  Tablet, Oral daily) Active. Montelukast Sodium (10MG  Tablet, Oral daily) Active. Ondansetron HCl (4MG  Tablet, Oral as needed) Active. PriLOSEC (20MG  Capsule DR, Oral daily) Active. Fexofenadine HCl (180MG  Tablet, Oral daily) Active.  Vitals (Sonya Bynum CMA; 01/06/2014 4:15 PM) 01/06/2014 4:15 PM Weight: 215 lb Height: 64in Body Surface Area: 2.1 m Body Mass Index: 36.9 kg/m Temp.: 98.77F(Temporal)  BP: 120/80 (Sitting, Left Arm, Standard)    Physical Exam Paige Alvarez(Ingeborg Fite T. Teandre Hamre MD; 01/06/2014 5:11 PM) The physical exam findings are as follows: Note:General: Alert, Moderately obese Caucasian female, in no distress Skin: Warm and dry without rash or infection. HEENT: No palpable masses or thyromegaly. Sclera  nonicteric. Pupils equal round and reactive. Oropharynx clear. Lymph nodes: No cervical, supraclavicular, or inguinal nodes palpable. Lungs: Breath sounds clear and equal. No wheezing or increased work of breathing. Cardiovascular: Regular rate and rhythm without murmer. No JVD or edema. Peripheral pulses intact. No carotid bruits. Abdomen: Nondistended. Soft with very mild right upper quadrant tenderness,no guarding. No masses palpable. No organomegaly. No palpable hernias. Extremities: No edema or joint swelling or deformity. No chronic venous stasis changes. Neurologic: Alert and fully oriented. Gait normal. No focal weakness. Psychiatric: Normal mood and affect. Thought content appropriate with normal judgement and insight   abdominal ultrasound performed 01/03/2014 revealed multiple small gallstones, normal gallbladder wall,normal common bile duct and increased echogenicity of the liver    Assessment & Plan Paige Alvarez(Nilza Eaker T. 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 I've recommended laparoscopic cholecystectomy with cholangiogram. I discussed the procedure in detail. The patient was given Agricultural engineereducational material. We discussed the risks and benefits of a laparoscopic cholecystectomy and possible cholangiogram including, but not limited to, bleeding, infection, injury to surrounding structures such as the intestine or liver, bile leak, retained gallstones, need to convert to an open procedure, prolonged diarrhea, blood clots such as DVT, common bile duct injury, anesthesia risks, and possible need for additional procedures. The likelihood of improvement in symptoms and return to the patient's normal status is good. We discussed the typical post-operative recovery course. All questions were answered. Current Plans  Schedule for Surgery

## 2014-01-12 NOTE — Transfer of Care (Signed)
Immediate Anesthesia Transfer of Care Note  Patient: Paige ReeveDeborah A Alvarez  Procedure(s) Performed: Procedure(s): LAPAROSCOPIC CHOLECYSTECTOMY (N/A)  Patient Location: PACU  Anesthesia Type:General  Level of Consciousness: awake, alert , oriented and patient cooperative  Airway & Oxygen Therapy: Patient Spontanous Breathing and Patient connected to face mask oxygen  Post-op Assessment: Report given to PACU RN, Post -op Vital signs reviewed and stable and Patient moving all extremities X 4  Post vital signs: stable  Complications: No apparent anesthesia complications

## 2014-01-13 ENCOUNTER — Encounter (HOSPITAL_COMMUNITY): Payer: Self-pay | Admitting: General Surgery

## 2014-01-14 ENCOUNTER — Encounter (HOSPITAL_COMMUNITY): Payer: Self-pay | Admitting: Emergency Medicine

## 2014-01-14 ENCOUNTER — Emergency Department (HOSPITAL_COMMUNITY): Payer: BC Managed Care – PPO

## 2014-01-14 ENCOUNTER — Emergency Department (HOSPITAL_COMMUNITY)
Admission: EM | Admit: 2014-01-14 | Discharge: 2014-01-14 | Disposition: A | Discharge status: Home / Self Care | Payer: BC Managed Care – PPO | Attending: Emergency Medicine | Admitting: Emergency Medicine

## 2014-01-14 DIAGNOSIS — R112 Nausea with vomiting, unspecified: Secondary | ICD-10-CM | POA: Diagnosis not present

## 2014-01-14 DIAGNOSIS — Z79899 Other long term (current) drug therapy: Secondary | ICD-10-CM | POA: Diagnosis not present

## 2014-01-14 DIAGNOSIS — K5900 Constipation, unspecified: Secondary | ICD-10-CM | POA: Diagnosis not present

## 2014-01-14 DIAGNOSIS — R109 Unspecified abdominal pain: Secondary | ICD-10-CM

## 2014-01-14 DIAGNOSIS — R1032 Left lower quadrant pain: Secondary | ICD-10-CM | POA: Insufficient documentation

## 2014-01-14 DIAGNOSIS — D509 Iron deficiency anemia, unspecified: Secondary | ICD-10-CM | POA: Insufficient documentation

## 2014-01-14 DIAGNOSIS — I10 Essential (primary) hypertension: Secondary | ICD-10-CM | POA: Insufficient documentation

## 2014-01-14 DIAGNOSIS — J45909 Unspecified asthma, uncomplicated: Secondary | ICD-10-CM | POA: Insufficient documentation

## 2014-01-14 DIAGNOSIS — Z90710 Acquired absence of both cervix and uterus: Secondary | ICD-10-CM | POA: Diagnosis not present

## 2014-01-14 DIAGNOSIS — Z9089 Acquired absence of other organs: Secondary | ICD-10-CM | POA: Diagnosis not present

## 2014-01-14 DIAGNOSIS — R1031 Right lower quadrant pain: Secondary | ICD-10-CM | POA: Diagnosis not present

## 2014-01-14 LAB — CBC WITH DIFFERENTIAL/PLATELET
Basophils Absolute: 0 10*3/uL (ref 0.0–0.1)
Basophils Relative: 0 % (ref 0–1)
Eosinophils Absolute: 0.1 10*3/uL (ref 0.0–0.7)
Eosinophils Relative: 1 % (ref 0–5)
HEMATOCRIT: 42.7 % (ref 36.0–46.0)
HEMOGLOBIN: 13.9 g/dL (ref 12.0–15.0)
Lymphocytes Relative: 11 % — ABNORMAL LOW (ref 12–46)
Lymphs Abs: 1.8 10*3/uL (ref 0.7–4.0)
MCH: 28.7 pg (ref 26.0–34.0)
MCHC: 32.6 g/dL (ref 30.0–36.0)
MCV: 88 fL (ref 78.0–100.0)
MONO ABS: 1 10*3/uL (ref 0.1–1.0)
MONOS PCT: 6 % (ref 3–12)
NEUTROS ABS: 13 10*3/uL — AB (ref 1.7–7.7)
Neutrophils Relative %: 82 % — ABNORMAL HIGH (ref 43–77)
Platelets: 317 10*3/uL (ref 150–400)
RBC: 4.85 MIL/uL (ref 3.87–5.11)
RDW: 13.5 % (ref 11.5–15.5)
WBC: 16 10*3/uL — ABNORMAL HIGH (ref 4.0–10.5)

## 2014-01-14 LAB — COMPREHENSIVE METABOLIC PANEL
ALT: 147 U/L — ABNORMAL HIGH (ref 0–35)
AST: 59 U/L — ABNORMAL HIGH (ref 0–37)
Albumin: 4.1 g/dL (ref 3.5–5.2)
Alkaline Phosphatase: 112 U/L (ref 39–117)
Anion gap: 14 (ref 5–15)
BUN: 10 mg/dL (ref 6–23)
CO2: 27 mEq/L (ref 19–32)
CREATININE: 0.6 mg/dL (ref 0.50–1.10)
Calcium: 9.5 mg/dL (ref 8.4–10.5)
Chloride: 99 mEq/L (ref 96–112)
GLUCOSE: 117 mg/dL — AB (ref 70–99)
Potassium: 3.3 mEq/L — ABNORMAL LOW (ref 3.7–5.3)
Sodium: 140 mEq/L (ref 137–147)
Total Bilirubin: 0.4 mg/dL (ref 0.3–1.2)
Total Protein: 7.5 g/dL (ref 6.0–8.3)

## 2014-01-14 LAB — URINALYSIS, ROUTINE W REFLEX MICROSCOPIC
BILIRUBIN URINE: NEGATIVE
Glucose, UA: NEGATIVE mg/dL
Ketones, ur: NEGATIVE mg/dL
Leukocytes, UA: NEGATIVE
Nitrite: NEGATIVE
Protein, ur: NEGATIVE mg/dL
Specific Gravity, Urine: 1.014 (ref 1.005–1.030)
UROBILINOGEN UA: 1 mg/dL (ref 0.0–1.0)
pH: 6 (ref 5.0–8.0)

## 2014-01-14 LAB — URINE MICROSCOPIC-ADD ON

## 2014-01-14 LAB — LIPASE, BLOOD: LIPASE: 22 U/L (ref 11–59)

## 2014-01-14 LAB — I-STAT CG4 LACTIC ACID, ED: Lactic Acid, Venous: 1.41 mmol/L (ref 0.5–2.2)

## 2014-01-14 MED ORDER — HYDROMORPHONE HCL 1 MG/ML IJ SOLN
1.0000 mg | Freq: Once | INTRAMUSCULAR | Status: AC
  Administered 2014-01-14: 1 mg via INTRAVENOUS
  Filled 2014-01-14: qty 1

## 2014-01-14 MED ORDER — FLEET PEDIATRIC 3.5-9.5 GM/59ML RE ENEM: 1.0000 | ENEMA | Freq: Once | RECTAL | Status: DC

## 2014-01-14 MED ORDER — PROMETHAZINE HCL 25 MG/ML IJ SOLN
25.0000 mg | Freq: Once | INTRAMUSCULAR | Status: AC
  Administered 2014-01-14: 25 mg via INTRAVENOUS
  Filled 2014-01-14: qty 1

## 2014-01-14 MED ORDER — ONDANSETRON HCL 4 MG/2ML IJ SOLN
4.0000 mg | Freq: Once | INTRAMUSCULAR | Status: AC
  Administered 2014-01-14: 4 mg via INTRAVENOUS
  Filled 2014-01-14: qty 2

## 2014-01-14 MED ORDER — MILK AND MOLASSES ENEMA
1.0000 | Freq: Once | RECTAL | Status: AC
  Administered 2014-01-14: 250 mL via RECTAL
  Filled 2014-01-14: qty 250

## 2014-01-14 MED ORDER — IOHEXOL 300 MG/ML  SOLN
100.0000 mL | Freq: Once | INTRAMUSCULAR | Status: AC | PRN
  Administered 2014-01-14: 100 mL via INTRAVENOUS

## 2014-01-14 MED ORDER — POLYETHYLENE GLYCOL 3350 17 GM/SCOOP PO POWD: 17.0000 g | Freq: Two times a day (BID) | ORAL | Status: DC

## 2014-01-14 MED ORDER — IOHEXOL 300 MG/ML  SOLN
50.0000 mL | Freq: Once | INTRAMUSCULAR | Status: AC | PRN
  Administered 2014-01-14: 50 mL via ORAL

## 2014-01-14 NOTE — Discharge Instructions (Signed)

## 2014-01-14 NOTE — ED Provider Notes (Signed)
CSN: [id number]     Arrival date & time 01/14/14  1557 History   First MD Initiated Contact with Patient 01/14/14 1643     Chief Complaint  Patient presents with  . Abdominal Pain     (Consider location/radiation/quality/duration/timing/severity/associated sxs/prior Treatment) HPI Comments: Patient is a 48 year old female with history of hypertension, asthma, hiatal hernia who presents to the emergency department today with nausea, vomiting, abdominal pain. She reports that she had a laparoscopic cholecystectomy done by Dr. Johna Sheriff on Thursday. At 3 PM today she developed sudden onset, severe, cramping abdominal pain. The pain is intermittent. She has associated dry heaves. She reports that she has struggled with having bowel movements since before the surgery. She passed a small, pellet-like stool earlier today. No fevers, chills.  Patient is a 48 y.o. female presenting with abdominal pain. The history is provided by the patient. No language interpreter was used.  Abdominal Pain Associated symptoms: constipation, nausea and vomiting   Associated symptoms: no chest pain, no chills, no diarrhea, no fever and no shortness of breath     Past Medical History  Diagnosis Date  . Hypertension   . Asthma     allergy related   . Anemia     iron deficiency anemia   . PONV (postoperative nausea and vomiting)   . H/O hiatal hernia     repair with weight loss surgery    Past Surgical History  Procedure Laterality Date  . Tonsillectomy    . Uterine ablaton     . Gastric sleeve wtih hiatal hernia repair     . Cholecystectomy N/A 01/12/2014    Procedure: LAPAROSCOPIC CHOLECYSTECTOMY;  Surgeon: Glenna Fellows, MD;  Location: [facility];  Service: General;  Laterality: N/A;   No family history on file. History  Substance Use Topics  . Smoking status: Never Smoker   . Smokeless tobacco: Never Used  . Alcohol Use: Yes     Comment: rare   OB History   Grav Para Term Preterm Abortions TAB  SAB Ect Mult Living                 Review of Systems  Constitutional: Negative for fever and chills.  Respiratory: Negative for shortness of breath.   Cardiovascular: Negative for chest pain.  Gastrointestinal: Positive for nausea, vomiting, abdominal pain and constipation. Negative for diarrhea.  All other systems reviewed and are negative.     Allergies  Other and Percocet  Home Medications   Prior to Admission medications   Medication Sig Start Date End Date Taking? Authorizing Provider  albuterol (VENTOLIN HFA) 108 (90 BASE) MCG/ACT inhaler Inhale 2 puffs into the lungs every 6 (six) hours as needed for wheezing or shortness of breath.   Yes Historical Provider, MD  amLODipine (NORVASC) 5 MG tablet Take 5 mg by mouth every morning.   Yes Historical Provider, MD  Ferrous Sulfate (PX IRON) 27 MG TABS Take 1 tablet by mouth every morning.   Yes Historical Provider, MD  fexofenadine (ALLEGRA) 180 MG tablet Take 180 mg by mouth every morning.   Yes Historical Provider, MD  fluticasone (FLOVENT HFA) 110 MCG/ACT inhaler Inhale 2 puffs into the lungs 2 (two) times daily as needed (shortness of breathe.).   Yes Historical Provider, MD  HYDROcodone-acetaminophen (NORCO/VICODIN) 5-325 MG per tablet Take 1-2 tablets by mouth every 4 (four) hours as needed for moderate pain or severe pain. 01/12/14  Yes Glenna Fellows, MD  montelukast (SINGULAIR) 10 MG tablet Take 10 mg  by mouth every morning.   Yes Historical Provider, MD  Multiple Vitamin (MULTIVITAMIN WITH MINERALS) TABS tablet Take 1 tablet by mouth every morning.   Yes Historical Provider, MD  Naproxen Sodium (ALEVE) 220 MG CAPS Take 1 capsule by mouth 2 (two) times daily as needed (pain.).   Yes Historical Provider, MD  omeprazole (PRILOSEC) 20 MG capsule Take 20 mg by mouth every morning.   Yes Historical Provider, MD  ondansetron (ZOFRAN) 4 MG tablet Take 4 mg by mouth every 8 (eight) hours as needed for nausea or vomiting.   Yes  Historical Provider, MD   BP 141/78  Pulse 86  Temp(Src) 98.5 F (36.9 C) (Oral)  Resp 16  SpO2 94% Physical Exam  Nursing note and vitals reviewed. Constitutional: She is oriented to person, place, and time. She appears well-developed and well-nourished. She appears distressed.  HENT:  Head: Normocephalic and atraumatic.  Right Ear: External ear normal.  Left Ear: External ear normal.  Nose: Nose normal.  Mouth/Throat: Oropharynx is clear and moist.  Eyes: Conjunctivae are normal.  Neck: Normal range of motion.  Cardiovascular: Normal rate, regular rhythm and normal heart sounds.   Pulmonary/Chest: Effort normal and breath sounds normal. No stridor. No respiratory distress. She has no wheezes. She has no rales.  Abdominal: Soft. She exhibits no distension. There is tenderness in the right lower quadrant, suprapubic area and left lower quadrant.  Incision sites with dermabond. No surrounding erythema, drainage. Appear to be healing well.   Musculoskeletal: Normal range of motion.  Neurological: She is alert and oriented to person, place, and time. She has normal strength.  Skin: Skin is warm and dry. She is not diaphoretic. No erythema.  Psychiatric: She has a normal mood and affect. Her behavior is normal.    ED Course  Procedures (including critical care time) Labs Review Labs Reviewed  CBC WITH DIFFERENTIAL - Abnormal; Notable for the following:    WBC 16.0 (*)    Neutrophils Relative % 82 (*)    Neutro Abs 13.0 (*)    Lymphocytes Relative 11 (*)    All other components within normal limits  COMPREHENSIVE METABOLIC PANEL - Abnormal; Notable for the following:    Potassium 3.3 (*)    Glucose, Bld 117 (*)    AST 59 (*)    ALT 147 (*)    All other components within normal limits  URINALYSIS, ROUTINE W REFLEX MICROSCOPIC - Abnormal; Notable for the following:    Color, Urine AMBER (*)    Hgb urine dipstick TRACE (*)    All other components within normal limits  LIPASE,  BLOOD  URINE MICROSCOPIC-ADD ON  I-STAT CG4 LACTIC ACID, ED    Imaging Review Ct Abdomen Pelvis W Contrast  01/14/2014   CLINICAL DATA:  Cholecystectomy 2 days ago. Diffuse abdominal pain and cramping.  EXAM: CT ABDOMEN AND PELVIS WITH CONTRAST  TECHNIQUE: Multidetector CT imaging of the abdomen and pelvis was performed using the standard protocol following bolus administration of intravenous contrast.  CONTRAST:  100mL OMNIPAQUE IOHEXOL 300 MG/ML SOLN, 50mL OMNIPAQUE IOHEXOL 300 MG/ML SOLN  COMPARISON:  None.  FINDINGS: Linear atelectasis in the left lung base. Right lung base is clear. Heart is normal size. No effusions.  Prior cholecystectomy. Slight haziness/ inflammation in the gallbladder fossa and extending to the hepatic flexure of the adjacent: . No focal fluid collection or free fluid. Liver, spleen, pancreas, adrenals and kidneys are unremarkable. Tiny cysts in the right kidney. No hydronephrosis.  There is sigmoid diverticulosis. No active diverticulitis. Moderate stool burden throughout the colon. Postoperative changes within the proximal stomach. Stomach otherwise unremarkable. Small bowel decompressed.  Uterus, adnexae and urinary bladder are unremarkable. Small follicles in the left ovary.  Aorta is normal caliber. Circumaortic left renal vein incidentally noted.  No acute bony abnormality or focal bone lesion.  IMPRESSION: Post cholecystectomy. Slight stranding in the gallbladder fossa and extending to the adjacent right colon, likely postoperative inflammation/stranding. No focal fluid collection or free fluid/ air.  Moderate stool burden throughout the colon.  Sigmoid diverticulosis.   Electronically Signed   By: Charlett NoseKevin  Dover M.D.   On: 01/14/2014 18:59     EKG Interpretation None      MDM   Final diagnoses:  Abdominal pain    Patient presents emergency department for evaluation of abdominal pain. Recent laparoscopic cholecystectomy done on Thursday. Labs are generally  unremarkable. Patient with leukocytosis which could be related to surgery. CT abdomen and pelvis is grossly unremarkable. Dr. Derrell Lollingamirez evaluated CT scan as well. He reports she can follow up with Dr. Johna SheriffHoxworth as scheduled. Likely pain due to constipation. Patient given an enema in emergency department with little relief of her symptoms. Patient will be sent home with prescription for fleets enema and MiraLAX. Discussed reasons to return to emergency department immediately. Vital signs stable for discharge. Dr. Rhunette CroftNanavati evaluated patient and agrees with plan.  Patient / Family / Caregiver informed of clinical course, understand medical decision-making process, and agree with plan.     Mora BellmanHannah S Emelio Schneller, PA-C 01/15/14 2016

## 2014-01-14 NOTE — ED Notes (Signed)
She c/o generalized abd. Pan with n/v.  She states she had lap. Chole. Here this Thurs. By Dr. Johna SheriffHoxworth.  She states she had constipation issues preoperatively and this may be related to that.

## 2014-01-16 NOTE — ED Provider Notes (Signed)
Medical screening examination/treatment/procedure(s) were conducted as a shared visit with non-physician practitioner(s) and myself.  I personally evaluated the patient during the encounter.   EKG Interpretation None       Pt is post op from chole, pod #1. She is having lower quadrant abd pain, intermittent, crampy, no normal BM since surgery, and hypoactive bowel sounds. CT is neg, will give enema. Stable for discharge.  Derwood KaplanAnkit Zaleigh Bermingham, MD 01/16/14 [phone]

## 2014-01-17 ENCOUNTER — Telehealth (INDEPENDENT_AMBULATORY_CARE_PROVIDER_SITE_OTHER): Payer: Self-pay | Admitting: General Surgery

## 2014-01-17 NOTE — Telephone Encounter (Signed)
Call the patient. Feeling significantly better. ill keep initial routine postop appointment.

## 2014-01-17 NOTE — Addendum Note (Signed)
Addendum created 01/17/14 1100 by Gaetano Hawthorneharles L Trystan Eads, MD   Modules edited: Anesthesia Attestations

## 2014-09-27 ENCOUNTER — Other Ambulatory Visit: Payer: Self-pay | Admitting: Gastroenterology

## 2014-09-27 DIAGNOSIS — R1011 Right upper quadrant pain: Secondary | ICD-10-CM

## 2014-10-03 ENCOUNTER — Other Ambulatory Visit (HOSPITAL_COMMUNITY)
Admission: RE | Admit: 2014-10-03 | Discharge: 2014-10-03 | Disposition: A | Discharge status: Home / Self Care | Payer: BC Managed Care – PPO | Source: Ambulatory Visit | Attending: Family Medicine | Admitting: Family Medicine

## 2014-10-03 ENCOUNTER — Other Ambulatory Visit: Payer: Self-pay | Admitting: Physician Assistant

## 2014-10-03 DIAGNOSIS — Z124 Encounter for screening for malignant neoplasm of cervix: Secondary | ICD-10-CM | POA: Insufficient documentation

## 2014-10-04 ENCOUNTER — Other Ambulatory Visit: Payer: Self-pay | Admitting: Internal Medicine

## 2014-10-04 DIAGNOSIS — Z1231 Encounter for screening mammogram for malignant neoplasm of breast: Secondary | ICD-10-CM

## 2014-10-05 LAB — CYTOLOGY - PAP

## 2014-10-06 ENCOUNTER — Ambulatory Visit
Admission: RE | Admit: 2014-10-06 | Discharge: 2014-10-06 | Disposition: A | Discharge status: Home / Self Care | Payer: BC Managed Care – PPO | Source: Ambulatory Visit | Attending: Gastroenterology | Admitting: Gastroenterology

## 2014-10-06 DIAGNOSIS — R1011 Right upper quadrant pain: Secondary | ICD-10-CM

## 2014-10-06 MED ORDER — GADOBENATE DIMEGLUMINE 529 MG/ML IV SOLN
20.0000 mL | Freq: Once | INTRAVENOUS | Status: AC | PRN
  Administered 2014-10-06: 20 mL via INTRAVENOUS

## 2014-11-06 ENCOUNTER — Ambulatory Visit
Admission: RE | Admit: 2014-11-06 | Discharge: 2014-11-06 | Disposition: A | Discharge status: Home / Self Care | Payer: BC Managed Care – PPO | Source: Ambulatory Visit | Attending: Internal Medicine | Admitting: Internal Medicine

## 2014-11-06 DIAGNOSIS — Z1231 Encounter for screening mammogram for malignant neoplasm of breast: Secondary | ICD-10-CM

## 2015-07-17 ENCOUNTER — Encounter: Payer: Self-pay | Admitting: Urology

## 2015-07-17 ENCOUNTER — Ambulatory Visit (INDEPENDENT_AMBULATORY_CARE_PROVIDER_SITE_OTHER): Payer: BC Managed Care – PPO | Admitting: Urology

## 2015-07-17 VITALS — BP 118/77 | HR 83 | Temp 97.5°F | Resp 16 | Ht 64.0 in | Wt 210.0 lb

## 2015-07-17 DIAGNOSIS — R300 Dysuria: Secondary | ICD-10-CM | POA: Diagnosis not present

## 2015-07-17 DIAGNOSIS — F32A Depression, unspecified: Secondary | ICD-10-CM | POA: Insufficient documentation

## 2015-07-17 DIAGNOSIS — E7800 Pure hypercholesterolemia, unspecified: Secondary | ICD-10-CM | POA: Insufficient documentation

## 2015-07-17 DIAGNOSIS — R3129 Other microscopic hematuria: Secondary | ICD-10-CM

## 2015-07-17 DIAGNOSIS — F329 Major depressive disorder, single episode, unspecified: Secondary | ICD-10-CM | POA: Insufficient documentation

## 2015-07-17 DIAGNOSIS — R350 Frequency of micturition: Secondary | ICD-10-CM | POA: Diagnosis not present

## 2015-07-17 DIAGNOSIS — K219 Gastro-esophageal reflux disease without esophagitis: Secondary | ICD-10-CM | POA: Insufficient documentation

## 2015-07-17 DIAGNOSIS — K589 Irritable bowel syndrome without diarrhea: Secondary | ICD-10-CM | POA: Insufficient documentation

## 2015-07-17 DIAGNOSIS — F419 Anxiety disorder, unspecified: Secondary | ICD-10-CM | POA: Insufficient documentation

## 2015-07-17 LAB — BLADDER SCAN AMB NON-IMAGING: Scan Result: 25

## 2015-07-17 NOTE — Progress Notes (Signed)
07/17/2015 4:02 PM   Paige Alvarez June 25, 1965 [id number]  Referring provider: Blair Heysobert Ehinger, MD [address] [place], [place] [address]  Chief Complaint  Patient presents with  . New Patient (Initial Visit)  . Urinary Tract Infection    HPI: Patient is a 50 year old Caucasian female with a history of urinary tract infection and urethral irritation who is referred by her PCP, Noelle Redmon PA, for further evaluation and management.  Patient states that in January 2017, she was experiencing dysuria with chills. She went to an after hours clinic and was told that her UA look suspicious for infection.  She had been initiated on antibiotics, but they did not provide relief. She states that she is had 2 urine cultures which have been negative.  She has a constant burning in the urethral area and chills with voiding. AZO relieves the discomfort.  She states that stress does make the symptoms worse.  She also has urinary urgency, but she denies incomplete bladder emptying and urinary leakage.  She does not have a prior history of kidney stone disease or family history of kidney stone disease. She states her maternal aunt has interstitial cystitis.  She denied gross hematuria, but she was found to have 3-10 rbc's per high-power field on today's exam.    She has not had recent fevers, chills, nausea or vomiting.   PMH: Past Medical History  Diagnosis Date  . Hypertension   . Asthma     allergy related   . Anemia     iron deficiency anemia   . PONV (postoperative nausea and vomiting)   . H/O hiatal hernia     repair with weight loss surgery     Surgical History: Past Surgical History  Procedure Laterality Date  . Tonsillectomy    . Uterine ablaton     . Gastric sleeve wtih hiatal hernia repair     . Cholecystectomy N/A 01/12/2014    Procedure: LAPAROSCOPIC CHOLECYSTECTOMY;  Surgeon: Glenna FellowsBenjamin Hoxworth, MD;  Location: [facility];  Service: General;  Laterality: N/A;    . Cholecystectomy  2015    Home Medications:    Medication List       This list is accurate as of: 07/17/15 11:59 PM.  Always use your most recent med list.               ALEVE 220 MG Caps  Generic drug:  Naproxen Sodium  Take 1 capsule by mouth 2 (two) times daily as needed (pain.).     amLODipine 5 MG tablet  Commonly known as:  NORVASC  Take 5 mg by mouth every morning.     fexofenadine 180 MG tablet  Commonly known as:  ALLEGRA  Take 180 mg by mouth every morning.     fluticasone 110 MCG/ACT inhaler  Commonly known as:  FLOVENT HFA  Inhale 2 puffs into the lungs 2 (two) times daily as needed (shortness of breathe.).     HYDROcodone-acetaminophen 5-325 MG tablet  Commonly known as:  NORCO/VICODIN  Take 1-2 tablets by mouth every 4 (four) hours as needed for moderate pain or severe pain.     metoprolol succinate 25 MG 24 hr tablet  Commonly known as:  TOPROL-XL  Take 25 mg by mouth daily.     montelukast 10 MG tablet  Commonly known as:  SINGULAIR  Take 10 mg by mouth every morning.     multivitamin with minerals Tabs tablet  Take 1 tablet by mouth  every morning.     omeprazole 20 MG capsule  Commonly known as:  PRILOSEC  Take 20 mg by mouth every morning.     ondansetron 4 MG tablet  Commonly known as:  ZOFRAN  Take 4 mg by mouth every 8 (eight) hours as needed for nausea or vomiting.     polyethylene glycol powder powder  Commonly known as:  GLYCOLAX/MIRALAX  Take 17 g by mouth 2 (two) times daily. Until daily soft stools  OTC     PX IRON 27 MG Tabs  Generic drug:  Ferrous Sulfate  Take 1 tablet by mouth every morning.     sodium phosphate Pediatric 3.5-9.5 GM/59ML enema  Place 66 mLs (1 enema total) rectally once.     VENTOLIN HFA 108 (90 Base) MCG/ACT inhaler  Generic drug:  albuterol  Inhale 2 puffs into the lungs every 6 (six) hours as needed for wheezing or shortness of breath.        Allergies:  Allergies  Allergen Reactions  .  Other Swelling and Other (See Comments)    "Anything with Mint". Ingested- Swelling. On skin- Starts to blister.   Marland Kitchen Percocet [Oxycodone-Acetaminophen] Nausea Only    Family History: Family History  Problem Relation Age of Onset  . Cancer Father     prostate  . Liver disease Mother     Social History:  reports that she has never smoked. She has never used smokeless tobacco. She reports that she drinks alcohol. She reports that she does not use illicit drugs.  ROS: UROLOGY Frequent Urination?: No Hard to postpone urination?: Yes Burning/pain with urination?: Yes Get up at night to urinate?: No Leakage of urine?: No Urine stream starts and stops?: No Trouble starting stream?: No Do you have to strain to urinate?: No Blood in urine?: No Urinary tract infection?: No Sexually transmitted disease?: No Injury to kidneys or bladder?: No Painful intercourse?: No Weak stream?: No Currently pregnant?: No Vaginal bleeding?: No Last menstrual period?: n  Gastrointestinal Nausea?: Yes Vomiting?: No Indigestion/heartburn?: No Diarrhea?: No Constipation?: No  Constitutional Fever: No Night sweats?: No Weight loss?: No Fatigue?: Yes  Skin Skin rash/lesions?: No Itching?: No  Eyes Blurred vision?: No Double vision?: No  Ears/Nose/Throat Sore throat?: No Sinus problems?: No  Hematologic/Lymphatic Swollen glands?: No Easy bruising?: No  Cardiovascular Leg swelling?: No Chest pain?: No  Respiratory Cough?: No Shortness of breath?: No  Endocrine Excessive thirst?: No  Musculoskeletal Back pain?: Yes Joint pain?: No  Neurological Headaches?: No Dizziness?: No  Psychologic Depression?: Yes Anxiety?: Yes  Physical Exam: BP 118/77 mmHg  Pulse 83  Temp(Src) 97.5 F (36.4 C)  Resp 16  Ht  (1.626 m)  Wt 210 lb (95.255 kg)  BMI 36.03 kg/m2  Constitutional: Well nourished. Alert and oriented, No acute distress. HEENT: [place] AT, moist mucus  membranes. Trachea midline, no masses. Cardiovascular: No clubbing, cyanosis, or edema. Respiratory: Normal respiratory effort, no increased work of breathing. GI: Abdomen is soft, non tender, non distended, no abdominal masses. Liver and spleen not palpable.  No hernias appreciated.  Stool sample for occult testing is not indicated.   GU: No CVA tenderness.  No bladder fullness or masses.  Normal external genitalia, normal pubic hair distribution, no lesions.  Normal urethral meatus, no lesions, no prolapse, no discharge.   No urethral masses, tenderness and/or tenderness. No bladder fullness, tenderness or masses. Normal vagina mucosa, good estrogen effect, no discharge, no lesions, good pelvic support, no cystocele or rectocele  noted.  No cervical motion tenderness.  Uterus is freely mobile and non-fixed.  No adnexal/parametria masses or tenderness noted.  Anus and perineum are without rashes or lesions.    Skin: No rashes, bruises or suspicious lesions. Lymph: No cervical or inguinal adenopathy. Neurologic: Grossly intact, no focal deficits, moving all 4 extremities. Psychiatric: Normal mood and affect.  Laboratory Data: Lab Results  Component Value Date   WBC 16.0* 01/14/2014   HGB 13.9 01/14/2014   HCT 42.7 01/14/2014   MCV 88.0 01/14/2014   PLT 317 01/14/2014    Lab Results  Component Value Date   CREATININE 0.56* 07/17/2015    Lab Results  Component Value Date   TSH 1.39 05/01/2011    Lab Results  Component Value Date   AST 59* 01/14/2014   Lab Results  Component Value Date   ALT 147* 01/14/2014     Urinalysis Results for orders placed or performed in visit on 07/17/15  Urinalysis  Result Value Ref Range   Specific Gravity, UA 1.025 1.005 - 1.030   pH, UA 5.5 5.0 - 7.5   Color, UA Yellow Yellow   Appearance Ur Cloudy (A) Clear   Leukocytes, UA 1+ (A) Negative   Protein, UA Negative Negative/Trace   Glucose, UA Negative Negative   Ketones, UA Negative  Negative   RBC, UA 1+ (A) Negative   Bilirubin, UA Negative Negative   Urobilinogen, Ur 0.2 0.2 - 1.0 mg/dL   Nitrite, UA Negative Negative  BUN+Creat  Result Value Ref Range   BUN 8 6 - 24 mg/dL   Creatinine, Ser 1.61 (L) 0.57 - 1.00 mg/dL   GFR calc non Af Amer 109 >59 mL/min/1.73   GFR calc Af Amer 126 >59 mL/min/1.73   BUN/Creatinine Ratio 14 9 - 23  BLADDER SCAN AMB NON-IMAGING  Result Value Ref Range   Scan Result 25      Assessment & Plan:    1. Microscopic hematuria:   Explained to patient the causes of blood in the urine are as follows: stones, UTI's, damage to the urinary tract and/or cancer.   It is explained to the patient that they will be scheduled for a CT Urogram with contrast material and that in rare instances, an allergic reaction can be serious and even life threatening with the injection of contrast material.   The patient denies any allergies to contrast, iodine and/or seafood and is not taking metformin.  I have explained to the patient that they will  be scheduled for a cystoscopy in our office to evaluate their bladder.  The cystoscopy consists of passing a tube with a lens up through their urethra and into their urinary bladder.   We will inject the urethra with a lidocaine gel prior to introducing the cystoscope to help with any discomfort during the procedure.   After the procedure, they might experience blood in the urine and discomfort with urination.  This will abate after the first few voids.  I have  encouraged the patient to increase water intake  during this time.  Patient denies any allergies to lidocaine.   - Urinalysis  2. Dysuria:   Patient is having persistent dysuria despite negative urine cultures. She will be undergoing CT urogram and cystoscopy in the future for further evaluation.  - BLADDER SCAN AMB NON-IMAGING   Return for CT Urogram report with cystoscopy.  These notes generated with voice recognition software. I apologize for  typographical errors.  Michiel Cowboy, PA-C  [facility] [address], [place], [place] [address] [phone]

## 2015-07-18 LAB — BUN+CREAT
BUN/Creatinine Ratio: 14 (ref 9–23)
BUN: 8 mg/dL (ref 6–24)
Creatinine, Ser: 0.56 mg/dL — ABNORMAL LOW (ref 0.57–1.00)
GFR calc Af Amer: 126 mL/min/{1.73_m2} (ref >59)
GFR, EST NON AFRICAN AMERICAN: 109 mL/min/{1.73_m2} (ref >59)

## 2015-07-18 LAB — URINALYSIS
Bilirubin, UA: NEGATIVE
Glucose, UA: NEGATIVE
KETONES UA: NEGATIVE
NITRITE UA: NEGATIVE
Protein, UA: NEGATIVE
Specific Gravity, UA: 1.025 (ref 1.005–1.030)
Urobilinogen, Ur: 0.2 mg/dL (ref 0.2–1.0)
pH, UA: 5.5 (ref 5.0–7.5)

## 2015-07-22 DIAGNOSIS — R3129 Other microscopic hematuria: Secondary | ICD-10-CM | POA: Insufficient documentation

## 2015-07-22 DIAGNOSIS — R300 Dysuria: Secondary | ICD-10-CM | POA: Insufficient documentation

## 2015-07-27 ENCOUNTER — Ambulatory Visit
Admission: RE | Admit: 2015-07-27 | Discharge: 2015-07-27 | Disposition: A | Discharge status: Home / Self Care | Payer: BC Managed Care – PPO | Source: Ambulatory Visit | Attending: Urology | Admitting: Urology

## 2015-07-27 DIAGNOSIS — N281 Cyst of kidney, acquired: Secondary | ICD-10-CM | POA: Insufficient documentation

## 2015-07-27 DIAGNOSIS — R3129 Other microscopic hematuria: Secondary | ICD-10-CM | POA: Diagnosis present

## 2015-07-27 MED ORDER — IOPAMIDOL (ISOVUE-300) INJECTION 61%
125.0000 mL | Freq: Once | INTRAVENOUS | Status: AC | PRN
  Administered 2015-07-27: 125 mL via INTRAVENOUS

## 2015-07-30 ENCOUNTER — Telehealth: Payer: Self-pay | Admitting: Urology

## 2015-07-30 NOTE — Telephone Encounter (Signed)
Spoke with pt who stated she previously spoke with Carollee HerterShannon and has already called PCP.

## 2015-07-30 NOTE — Telephone Encounter (Signed)
-----   Message from Harle BattiestShannon A McGowan, PA-C sent at 07/30/2015  7:49 AM EDT ----- I left a message on the patient's VM.  I would like to speak to her about her CT results.  Would she be available at lunch time?

## 2015-07-30 NOTE — Telephone Encounter (Signed)
I faxed the CT report to Dr. Carman ChingJames Edwards @ [phone]   Michelle

## 2015-07-31 ENCOUNTER — Telehealth: Payer: Self-pay | Admitting: Urology

## 2015-07-31 NOTE — Telephone Encounter (Signed)
I stated that the radiologist stated the findings were concerning for a neoplasm in her colon.  I explained that I was not an expert on GI issues and that Dr. Randa EvensEdwards may look at the CT scan and have a different interpretation.  Please call Dr. Randa EvensEdwards in the morning to see if they received the CT report.  She may want to go to the hospital and have a disc made of the CT scan so that Dr. Randa EvensEdwards can look at the images.  I don't think May 24th is too long to wait.  I didn't want her to wait for months to be seen.

## 2015-07-31 NOTE — Telephone Encounter (Signed)
Paige Alvarez called saying she was told by Paige Alvarez at Paige Alvarez office they didn't have the CT results we sent them yesterday. Paige Alvarez said she'll resend them to the office. She's also concerned because she said Paige Alvarez told her she needs to be seen ASAP and that it's emergent but the office told her they can't see her until May 24th. She's wondering if Paige Alvarez can contact Paige Alvarez personally to inform him of the seriousness of her results. She'd like a phone call regarding this.  Pt's ph# [phone] Thank you.

## 2015-08-02 NOTE — Telephone Encounter (Signed)
Spoke with pt and made aware Dr. Randa EvensEdwards has CT results.

## 2015-08-02 NOTE — Telephone Encounter (Signed)
[facility] who stated they do have pt CT results from 07/2015.

## 2015-08-10 ENCOUNTER — Other Ambulatory Visit: Payer: BC Managed Care – PPO

## 2015-08-10 ENCOUNTER — Encounter: Payer: Self-pay | Admitting: Urology

## 2015-08-10 ENCOUNTER — Other Ambulatory Visit: Payer: Self-pay | Admitting: Physician Assistant

## 2015-08-10 ENCOUNTER — Ambulatory Visit (INDEPENDENT_AMBULATORY_CARE_PROVIDER_SITE_OTHER): Payer: BC Managed Care – PPO | Admitting: Urology

## 2015-08-10 VITALS — BP 127/83 | HR 76 | Ht 64.0 in

## 2015-08-10 DIAGNOSIS — R3129 Other microscopic hematuria: Secondary | ICD-10-CM | POA: Diagnosis not present

## 2015-08-10 DIAGNOSIS — R300 Dysuria: Secondary | ICD-10-CM | POA: Diagnosis not present

## 2015-08-10 DIAGNOSIS — R935 Abnormal findings on diagnostic imaging of other abdominal regions, including retroperitoneum: Secondary | ICD-10-CM

## 2015-08-10 LAB — URINALYSIS, COMPLETE
Bilirubin, UA: NEGATIVE
Glucose, UA: NEGATIVE
Ketones, UA: NEGATIVE
NITRITE UA: NEGATIVE
Protein, UA: NEGATIVE
Specific Gravity, UA: <1.005 — ABNORMAL LOW (ref 1.005–1.030)
Urobilinogen, Ur: 0.2 mg/dL (ref 0.2–1.0)
pH, UA: 5.5 (ref 5.0–7.5)

## 2015-08-10 LAB — MICROSCOPIC EXAMINATION: RBC, UA: NONE SEEN /hpf (ref 0–?)

## 2015-08-10 MED ORDER — LIDOCAINE HCL 2 % EX GEL: 1.0000 "application " | Freq: Once | CUTANEOUS | Status: DC

## 2015-08-10 MED ORDER — CIPROFLOXACIN HCL 500 MG PO TABS: 500.0000 mg | ORAL_TABLET | Freq: Once | ORAL | Status: DC

## 2015-08-10 NOTE — Progress Notes (Signed)
08/10/2015 1:49 PM   Paige Alvarez 07-Aug-1965 [id number]  Referring provider: Gaynelle Arabian, MD [address] [place],  [address]  Chief Complaint  Patient presents with  . Cysto    microscopic hematuria     HPI: Patient is a 50 year old Caucasian female with a history of urinary tract infection and urethral irritation who is referred by her PCP, Noelle Redmon PA, for further evaluation and management.  Patient states that in January 2017, she was experiencing dysuria with chills. She went to an after hours clinic and was told that her UA look suspicious for infection. She had been initiated on antibiotics, but they did not provide relief. She states that she is had 2 urine cultures which have been negative. She has a constant burning in the urethral area and chills with voiding. AZO relieves the discomfort. She states that stress does make the symptoms worse. She also has urinary urgency, but she denies incomplete bladder emptying and urinary leakage.  She does not have a prior history of kidney stone disease or family history of kidney stone disease. She states her maternal aunt has interstitial cystitis. She denied gross hematuria, but she was found to have 3-10 rbc's per high-power field on today's exam.   She has not had recent fevers, chills, nausea or vomiting.   Interval history: The patient underwent a CT urogram which is negative except for she renal cyst. She did have some thickening of her terminal ileum and the patient is scheduled follow-up with her GI doctor to further evaluate this. She did undergo colonoscopy ready which was negative. She is undergoing further studies.   PMH: Past Medical History  Diagnosis Date  . Hypertension   . Asthma     allergy related   . Anemia     iron deficiency anemia   . PONV (postoperative nausea and vomiting)   . H/O hiatal hernia     repair with weight loss surgery     Surgical History: Past  Surgical History  Procedure Laterality Date  . Tonsillectomy    . Uterine ablaton     . Gastric sleeve wtih hiatal hernia repair     . Cholecystectomy N/A 01/12/2014    Procedure: LAPAROSCOPIC CHOLECYSTECTOMY;  Surgeon: Excell Seltzer, MD;  Location: [facility];  Service: General;  Laterality: N/A;  . Cholecystectomy  2015    Home Medications:    Medication List       This list is accurate as of: 08/10/15  1:49 PM.  Always use your most recent med list.               ALEVE 220 MG Caps  Generic drug:  Naproxen Sodium  Take 1 capsule by mouth 2 (two) times daily as needed (pain.). Reported on 08/10/2015     amLODipine 5 MG tablet  Commonly known as:  NORVASC  Take 5 mg by mouth every morning.     cyclobenzaprine 10 MG tablet  Commonly known as:  FLEXERIL  take 1 tablet by mouth UP TO three times a day BUT PRIMARILY AT BEDTIME DUE TO DROWSY SIDE EFFECT     DONNATAL 16.2 MG tablet  Generic drug:  belladona alk-PHENObarbital  Take 1 tablet by mouth 2 (two) times daily.     escitalopram 10 MG tablet  Commonly known as:  LEXAPRO     fexofenadine 180 MG tablet  Commonly known as:  ALLEGRA  Take 180 mg by mouth every morning. Reported on 08/10/2015  fluticasone 110 MCG/ACT inhaler  Commonly known as:  FLOVENT HFA  Inhale 2 puffs into the lungs 2 (two) times daily as needed (shortness of breathe.). Reported on 08/10/2015     GAVILYTE-N WITH FLAVOR PACK 420 g solution  Generic drug:  polyethylene glycol-electrolytes  take by mouth as directed     LORazepam 0.5 MG tablet  Commonly known as:  ATIVAN  Reported on 08/10/2015     metoprolol succinate 25 MG 24 hr tablet  Commonly known as:  TOPROL-XL  Take 25 mg by mouth daily.     montelukast 10 MG tablet  Commonly known as:  SINGULAIR  Take 10 mg by mouth every morning.     multivitamin with minerals Tabs tablet  Take 1 tablet by mouth every morning.     omeprazole 20 MG capsule  Commonly known as:  PRILOSEC  Take  20 mg by mouth every morning.     ondansetron 4 MG tablet  Commonly known as:  ZOFRAN  Take 4 mg by mouth every 8 (eight) hours as needed for nausea or vomiting.     polyethylene glycol powder powder  Commonly known as:  GLYCOLAX/MIRALAX  Take 17 g by mouth 2 (two) times daily. Until daily soft stools  OTC     PX IRON 27 MG Tabs  Generic drug:  Ferrous Sulfate  Take 1 tablet by mouth every morning. Reported on 08/10/2015     sulfamethoxazole-trimethoprim 800-160 MG tablet  Commonly known as:  BACTRIM DS,SEPTRA DS  Take 1 tablet by mouth every 12 (twelve) hours. Reported on 08/10/2015     VENTOLIN HFA 108 (90 Base) MCG/ACT inhaler  Generic drug:  albuterol  Inhale 2 puffs into the lungs every 6 (six) hours as needed for wheezing or shortness of breath.        Allergies:  Allergies  Allergen Reactions  . Other Swelling and Other (See Comments)    "Anything with Mint". Ingested- Swelling. On skin- Starts to blister.   Marland Kitchen Percocet [Oxycodone-Acetaminophen] Nausea Only    Family History: Family History  Problem Relation Age of Onset  . Cancer Father     prostate  . Liver disease Mother     Social History:  reports that she has never smoked. She has never used smokeless tobacco. She reports that she drinks alcohol. She reports that she does not use illicit drugs.  ROS:                                        Physical Exam: BP 127/83 mmHg  Pulse 76  Ht '5\' 4"'  (1.626 m)  Constitutional:  Alert and oriented, No acute distress. HEENT: [place] AT, moist mucus membranes.  Trachea midline, no masses. Cardiovascular: No clubbing, cyanosis, or edema. Respiratory: Normal respiratory effort, no increased work of breathing. GI: Abdomen is soft, nontender, nondistended, no abdominal masses GU: No CVA tenderness.  Skin: No rashes, bruises or suspicious lesions. Lymph: No cervical or inguinal adenopathy. Neurologic: Grossly intact, no focal deficits, moving all 4  extremities. Psychiatric: Normal mood and affect.  Laboratory Data: Lab Results  Component Value Date   WBC 16.0* 01/14/2014   HGB 13.9 01/14/2014   HCT 42.7 01/14/2014   MCV 88.0 01/14/2014   PLT 317 01/14/2014    Lab Results  Component Value Date   CREATININE 0.56* 07/17/2015    No results found for: PSA  No  results found for: TESTOSTERONE  No results found for: HGBA1C  Urinalysis    Component Value Date/Time   COLORURINE AMBER* 01/14/2014 1710   APPEARANCEUR Cloudy* 07/17/2015 1610   APPEARANCEUR CLEAR 01/14/2014 1710   LABSPEC 1.014 01/14/2014 1710   PHURINE 6.0 01/14/2014 1710   GLUCOSEU Negative 07/17/2015 1610   HGBUR TRACE* 01/14/2014 1710   BILIRUBINUR Negative 07/17/2015 1610   BILIRUBINUR NEGATIVE 01/14/2014 1710   KETONESUR NEGATIVE 01/14/2014 1710   PROTEINUR Negative 07/17/2015 1610   PROTEINUR NEGATIVE 01/14/2014 1710   UROBILINOGEN 1.0 01/14/2014 1710   NITRITE Negative 07/17/2015 1610   NITRITE NEGATIVE 01/14/2014 1710   LEUKOCYTESUR 1+* 07/17/2015 1610   LEUKOCYTESUR NEGATIVE 01/14/2014 1710    Pertinent Imaging:  CLINICAL DATA: Dysuria, chills, recent antibiotics for possible cystitis. Microscopic hematuria. Status post gastric sleeve, cholecystectomy, and uterine ablation.  EXAM: CT ABDOMEN AND PELVIS WITHOUT AND WITH CONTRAST  TECHNIQUE: Multidetector CT imaging of the abdomen and pelvis was performed following the standard protocol before and following the bolus administration of intravenous contrast.  CONTRAST: 126m ISOVUE-300 IOPAMIDOL (ISOVUE-300) INJECTION 61%  COMPARISON: MRI abdomen dated 10/06/2014. CT abdomen pelvis dated 01/14/2014.  FINDINGS: Lower chest: Lung bases are clear.  Hepatobiliary: Liver is notable for mild hepatic steatosis. No suspicious/enhancing hepatic lesions.  Status post cholecystectomy. No intrahepatic or extrahepatic ductal dilatation.  Pancreas: Within normal  limits.  Spleen: Within normal limits.  Adrenals/Urinary Tract: Adrenal glands are within normal limits.  Two right upper pole renal cysts measuring up to 7 mm (series 12/ image 23). No enhancing renal lesions.  No renal, ureteral, or bladder calculi. No hydronephrosis.  On delayed imaging, there are no filling defects in the bilateral opacified proximal collecting systems, ureters, or bladder.  Bladder is within normal limits.  Stomach/Bowel: Stomach is notable for a tiny hiatal hernia and postsurgical changes related to gastric sleeve.  No evidence of bowel obstruction.  Eccentric wall thickening/mass along the cecum/ascending colon near the terminal ileum (series 4/images 40, 43, 44, and 47). While infectious/ inflammatory colitis is possible, the appearance raises concern for underlying neoplasm.  Vascular/Lymphatic: No evidence of abdominal aortic aneurysm. Circumaortic left renal vein.  Small ileocolic nodes measuring up to 5 mm short axis are present in the right lower abdomen (series 4/ image 51), indeterminate.  Otherwise, no suspicious abdominopelvic lymphadenopathy.  Reproductive: Uterus is within normal limits.  Bilateral ovaries are within normal limits.  Other: No abdominopelvic ascites.  Musculoskeletal: Mild degenerative changes the visualized thoracolumbar spine.  IMPRESSION: No renal, ureteral, or bladder calculi. No hydronephrosis.  Two right upper pole renal cysts measuring up to 7 mm. No enhancing renal lesions.  No filling defects in the bilateral opacified proximal collecting systems, ureters, or bladder.  Eccentric wall thickening/mass along the ascending colon/cecum near the terminal ileum. While infectious/inflammatory colitis is possible, the appearance raises concern for underlying neoplasm. Colonoscopy is suggested.      Assessment & Plan:    1. Microscopic hematuria: Negative CT scan except for small  bilateral cysts. Her urine today just a possible infection. Though she has to negative cultures recently, I am hesitant to perform a cystoscopy until we have a negative current urine culture. We'll send urine for culture. She'll follow-up in one to 2 weeks for cystoscopy.  2. Dysuria: As above  3. Thickening of terminal ileum/cecum -follow up with GI   Return in about 2 weeks (around 08/24/2015) for cystoscopy.  BNickie Retort MD  [facility] [address]  [place], [place] [address] [phone]

## 2015-08-12 LAB — CULTURE, URINE COMPREHENSIVE

## 2015-08-13 ENCOUNTER — Ambulatory Visit
Admission: RE | Admit: 2015-08-13 | Discharge: 2015-08-13 | Disposition: A | Discharge status: Home / Self Care | Payer: BC Managed Care – PPO | Source: Ambulatory Visit | Attending: Physician Assistant | Admitting: Physician Assistant

## 2015-08-13 DIAGNOSIS — R935 Abnormal findings on diagnostic imaging of other abdominal regions, including retroperitoneum: Secondary | ICD-10-CM

## 2015-08-14 ENCOUNTER — Telehealth: Payer: Self-pay | Admitting: Urology

## 2015-08-14 ENCOUNTER — Telehealth: Payer: Self-pay

## 2015-08-14 NOTE — Telephone Encounter (Signed)
Opened in error

## 2015-08-14 NOTE — Telephone Encounter (Signed)
Patient has a positive urine culture from her cysto appt on Friday.  She is having some abdominal pain.  She uses the [facility], [address], [place], [place].  She would like a call to advise her if she has a prescription to pick up.  Ok to leave information on patient's voice mail.  Patient also expressed her concern over her CT scan.  She went to see her GI doctor and she says that they did not give any information about the CT findings.  She is concerned about her abdominal pain and feels that she is not getting any answers.  She has been rescheduled for a cysto on June 1st.

## 2015-08-14 NOTE — Telephone Encounter (Signed)
The last note that I see in the chart stated that Dr. Randa EvensEdwards had the CT report.  Would you call and confirm and ask Dr. Dahlia ClientEdward's office to give the patient a call as she is concerned.  Is she on an antibiotic for the positive urine culture?

## 2015-08-15 ENCOUNTER — Other Ambulatory Visit: Payer: Self-pay | Admitting: Urology

## 2015-08-15 MED ORDER — AMOXICILLIN-POT CLAVULANATE 875-125 MG PO TABS: 1.0000 | ORAL_TABLET | Freq: Two times a day (BID) | ORAL | Status: DC

## 2015-08-15 NOTE — Telephone Encounter (Signed)
Spoke with Dr. Randa EvensEdwards nurse who stated they have been in touch with the pt in reference to CT. They have moved up her appt, ordered a new CT scan, and will keep an weekly update on pt.

## 2015-08-15 NOTE — Telephone Encounter (Signed)
Patient notified and express understanding.  She will pick up the prescription and start taking today.

## 2015-08-15 NOTE — Telephone Encounter (Signed)
I've sent in a prescription into her pharmacy for an antibiotic.

## 2015-08-15 NOTE — Telephone Encounter (Signed)
LMOM

## 2015-08-15 NOTE — Telephone Encounter (Signed)
No pt is not currently taking an abx. ucx was sent to Dr. Sherryl BartersBudzyn. Left message for Dr. Randa EvensEdwards nurse to call back. She is currently in a meeting.

## 2015-08-22 ENCOUNTER — Other Ambulatory Visit: Payer: Self-pay | Admitting: Gastroenterology

## 2015-08-23 ENCOUNTER — Ambulatory Visit (INDEPENDENT_AMBULATORY_CARE_PROVIDER_SITE_OTHER): Payer: BC Managed Care – PPO | Admitting: Urology

## 2015-08-23 ENCOUNTER — Encounter: Payer: Self-pay | Admitting: Urology

## 2015-08-23 VITALS — BP 144/83 | HR 78 | Ht 64.0 in | Wt 206.3 lb

## 2015-08-23 DIAGNOSIS — R300 Dysuria: Secondary | ICD-10-CM | POA: Diagnosis not present

## 2015-08-23 DIAGNOSIS — R3129 Other microscopic hematuria: Secondary | ICD-10-CM

## 2015-08-23 NOTE — Progress Notes (Signed)
08/23/2015 3:38 PM   Paige Alvarez 16-Jan-1966 [id number]  Referring provider: Gaynelle Arabian, MD [address] [place], [place] [address]  Chief Complaint  Patient presents with  . Cysto    microscopic hematuria    HPI: Patient is a 50 year old Caucasian female with a history of urinary tract infection and urethral irritation who is referred by her PCP, Noelle Redmon PA, for further evaluation and management.  Patient states that in January 2017, she was experiencing dysuria with chills. She went to an after hours clinic and was told that her UA look suspicious for infection. She had been initiated on antibiotics, but they did not provide relief. She states that she is had 2 urine cultures which have been negative. She has a constant burning in the urethral area and chills with voiding. AZO relieves the discomfort. She states that stress does make the symptoms worse. She also has urinary urgency, but she denies incomplete bladder emptying and urinary leakage.  She does not have a prior history of kidney stone disease or family history of kidney stone disease. She states her maternal aunt has interstitial cystitis. She denied gross hematuria, but she was found to have 3-10 rbc's per high-power field on today's exam.   She has not had recent fevers, chills, nausea or vomiting.   Interval history: The patient underwent a CT urogram which is negative except for renal cyst. She did have some thickening of her terminal ileum and the patient is scheduled follow-up with her GI doctor to further evaluate this. She did undergo colonoscopy already which was negative. She is undergoing further studies.   Her cystoscopy was canceled last time due to concern for UTI. Culture was only positive for group B strep which is likely contaminant.  She no longer is experiencing dysuria.  PMH: Past Medical History  Diagnosis Date  . Hypertension   . Asthma     allergy related   .  Anemia     iron deficiency anemia   . PONV (postoperative nausea and vomiting)   . H/O hiatal hernia     repair with weight loss surgery     Surgical History: Past Surgical History  Procedure Laterality Date  . Tonsillectomy    . Uterine ablaton     . Gastric sleeve wtih hiatal hernia repair     . Cholecystectomy N/A 01/12/2014    Procedure: LAPAROSCOPIC CHOLECYSTECTOMY;  Surgeon: Excell Seltzer, MD;  Location: [facility];  Service: General;  Laterality: N/A;  . Cholecystectomy  2015    Home Medications:    Medication List       This list is accurate as of: 08/23/15  3:38 PM.  Always use your most recent med list.               ALEVE 220 MG Caps  Generic drug:  Naproxen Sodium  Take 1 capsule by mouth 2 (two) times daily as needed (pain.). Reported on 08/10/2015     amLODipine 5 MG tablet  Commonly known as:  NORVASC  Take 5 mg by mouth every morning.     cyclobenzaprine 10 MG tablet  Commonly known as:  FLEXERIL  take 1 tablet by mouth UP TO three times a day BUT PRIMARILY AT BEDTIME DUE TO DROWSY SIDE EFFECT     DONNATAL 16.2 MG tablet  Generic drug:  belladona alk-PHENObarbital  Take 1 tablet by mouth 2 (two) times daily.     escitalopram 10 MG tablet  Commonly  known as:  LEXAPRO     fexofenadine 180 MG tablet  Commonly known as:  ALLEGRA  Take 180 mg by mouth every morning. Reported on 08/10/2015     fluticasone 110 MCG/ACT inhaler  Commonly known as:  FLOVENT HFA  Inhale 2 puffs into the lungs 2 (two) times daily as needed (shortness of breathe.). Reported on 08/10/2015     GAVILYTE-N WITH FLAVOR PACK 420 g solution  Generic drug:  polyethylene glycol-electrolytes  take by mouth as directed     LORazepam 0.5 MG tablet  Commonly known as:  ATIVAN  Reported on 08/10/2015     metoprolol succinate 25 MG 24 hr tablet  Commonly known as:  TOPROL-XL  Take 25 mg by mouth daily.     montelukast 10 MG tablet  Commonly known as:  SINGULAIR  Take 10 mg by mouth  every morning.     multivitamin with minerals Tabs tablet  Take 1 tablet by mouth every morning.     omeprazole 20 MG capsule  Commonly known as:  PRILOSEC  Take 20 mg by mouth every morning.     ondansetron 4 MG tablet  Commonly known as:  ZOFRAN  Take 4 mg by mouth every 8 (eight) hours as needed for nausea or vomiting.     polyethylene glycol powder powder  Commonly known as:  GLYCOLAX/MIRALAX  Take 17 g by mouth 2 (two) times daily. Until daily soft stools  OTC     PX IRON 27 MG Tabs  Generic drug:  Ferrous Sulfate  Take 1 tablet by mouth every morning. Reported on 08/10/2015     VENTOLIN HFA 108 (90 Base) MCG/ACT inhaler  Generic drug:  albuterol  Inhale 2 puffs into the lungs every 6 (six) hours as needed for wheezing or shortness of breath.        Allergies:  Allergies  Allergen Reactions  . Other Swelling and Other (See Comments)    "Anything with Mint". Ingested- Swelling. On skin- Starts to blister.   Marland Kitchen Percocet [Oxycodone-Acetaminophen] Nausea Only    Family History: Family History  Problem Relation Age of Onset  . Cancer Father     prostate  . Liver disease Mother     Social History:  reports that she has never smoked. She has never used smokeless tobacco. She reports that she drinks alcohol. She reports that she does not use illicit drugs.  ROS:                                        Physical Exam: BP 144/83 mmHg  Pulse 78  Ht _0  (1.626 m)  Wt 206 lb 4.8 oz (93.577 kg)  BMI 35.39 kg/m2  Constitutional:  Alert and oriented, No acute distress. HEENT: [place] AT, moist mucus membranes.  Trachea midline, no masses. Cardiovascular: No clubbing, cyanosis, or edema. Respiratory: Normal respiratory effort, no increased work of breathing. GI: Abdomen is soft, nontender, nondistended, no abdominal masses GU: No CVA tenderness.  Skin: No rashes, bruises or suspicious lesions. Lymph: No cervical or inguinal adenopathy. Neurologic:  Grossly intact, no focal deficits, moving all 4 extremities. Psychiatric: Normal mood and affect.  Laboratory Data: Lab Results  Component Value Date   WBC 16.0* 01/14/2014   HGB 13.9 01/14/2014   HCT 42.7 01/14/2014   MCV 88.0 01/14/2014   PLT 317 01/14/2014    Lab Results  Component Value  Date   CREATININE 0.56* 07/17/2015    No results found for: PSA  No results found for: TESTOSTERONE  No results found for: HGBA1C  Urinalysis    Component Value Date/Time   COLORURINE AMBER* 01/14/2014 1710   APPEARANCEUR Clear 08/10/2015 1332   APPEARANCEUR CLEAR 01/14/2014 1710   LABSPEC 1.014 01/14/2014 1710   PHURINE 6.0 01/14/2014 1710   GLUCOSEU Negative 08/10/2015 1332   HGBUR TRACE* 01/14/2014 1710   BILIRUBINUR Negative 08/10/2015 1332   BILIRUBINUR NEGATIVE 01/14/2014 1710   KETONESUR NEGATIVE 01/14/2014 1710   PROTEINUR Negative 08/10/2015 1332   PROTEINUR NEGATIVE 01/14/2014 1710   UROBILINOGEN 1.0 01/14/2014 1710   NITRITE Negative 08/10/2015 1332   NITRITE NEGATIVE 01/14/2014 1710   LEUKOCYTESUR 1+* 08/10/2015 1332   LEUKOCYTESUR NEGATIVE 01/14/2014 1710       Cystoscopy Procedure Note  Patient identification was confirmed, informed consent was obtained, and patient was prepped using Betadine solution.  Lidocaine jelly was administered per urethral meatus.    Preoperative abx where received prior to procedure.    Procedure: - Flexible cystoscope introduced, without any difficulty.   - Thorough search of the bladder revealed:    normal urethral meatus    normal urothelium    no stones    no ulcers     no tumors    no urethral polyps    no trabeculation  - Ureteral orifices were normal in position and appearance.  Post-Procedure: - Patient tolerated the procedure well   Assessment & Plan:   1. Microscopic hematuria: Negative work up except for small bilateral renal cysts. Follow up in one year for repeat urinalysis.  2. Dysuria: Continue  AZO prn  3. Thickening of terminal ileum/cecum -follow up with GI as scheduled  Return in about 1 year (around 08/22/2016).  Nickie Retort, MD  [facility] [address] [place], [place] [address] [phone]

## 2015-08-24 LAB — URINALYSIS, COMPLETE
Bilirubin, UA: NEGATIVE
Glucose, UA: NEGATIVE
KETONES UA: NEGATIVE
Leukocytes, UA: NEGATIVE
NITRITE UA: NEGATIVE
Protein, UA: NEGATIVE
UUROB: 0.2 mg/dL (ref 0.2–1.0)
pH, UA: 6 (ref 5.0–7.5)

## 2015-08-24 LAB — MICROSCOPIC EXAMINATION
Bacteria, UA: NONE SEEN
Epithelial Cells (non renal): >10 /hpf — AB (ref 0–10)
RBC MICROSCOPIC, UA: NONE SEEN /HPF (ref 0–?)
WBC UA: NONE SEEN /HPF (ref 0–?)

## 2015-09-04 ENCOUNTER — Other Ambulatory Visit: Payer: Self-pay | Admitting: Gastroenterology

## 2015-09-04 DIAGNOSIS — R1011 Right upper quadrant pain: Secondary | ICD-10-CM

## 2015-09-07 ENCOUNTER — Ambulatory Visit
Admission: RE | Admit: 2015-09-07 | Discharge: 2015-09-07 | Disposition: A | Discharge status: Home / Self Care | Payer: BC Managed Care – PPO | Source: Ambulatory Visit | Attending: Gastroenterology | Admitting: Gastroenterology

## 2015-09-07 DIAGNOSIS — R1011 Right upper quadrant pain: Secondary | ICD-10-CM

## 2015-09-12 ENCOUNTER — Other Ambulatory Visit: Payer: BC Managed Care – PPO | Admitting: Urology

## 2015-10-10 ENCOUNTER — Other Ambulatory Visit: Payer: Self-pay | Admitting: Physician Assistant

## 2015-10-10 DIAGNOSIS — Z1231 Encounter for screening mammogram for malignant neoplasm of breast: Secondary | ICD-10-CM

## 2015-11-07 ENCOUNTER — Ambulatory Visit
Admission: RE | Admit: 2015-11-07 | Discharge: 2015-11-07 | Disposition: A | Discharge status: Home / Self Care | Payer: BC Managed Care – PPO | Source: Ambulatory Visit | Attending: Physician Assistant | Admitting: Physician Assistant

## 2015-11-07 ENCOUNTER — Ambulatory Visit: Payer: BC Managed Care – PPO

## 2015-11-07 DIAGNOSIS — Z1231 Encounter for screening mammogram for malignant neoplasm of breast: Secondary | ICD-10-CM

## 2016-03-25 ENCOUNTER — Ambulatory Visit (INDEPENDENT_AMBULATORY_CARE_PROVIDER_SITE_OTHER): Payer: Self-pay | Admitting: Orthopaedic Surgery

## 2016-03-28 ENCOUNTER — Ambulatory Visit (INDEPENDENT_AMBULATORY_CARE_PROVIDER_SITE_OTHER): Payer: BC Managed Care – PPO | Admitting: Family

## 2016-03-28 ENCOUNTER — Ambulatory Visit (INDEPENDENT_AMBULATORY_CARE_PROVIDER_SITE_OTHER): Payer: BC Managed Care – PPO

## 2016-03-28 ENCOUNTER — Encounter (INDEPENDENT_AMBULATORY_CARE_PROVIDER_SITE_OTHER): Payer: Self-pay

## 2016-03-28 VITALS — Ht 64.0 in | Wt 206.0 lb

## 2016-03-28 DIAGNOSIS — M7541 Impingement syndrome of right shoulder: Secondary | ICD-10-CM | POA: Diagnosis not present

## 2016-03-28 DIAGNOSIS — M25511 Pain in right shoulder: Secondary | ICD-10-CM | POA: Diagnosis not present

## 2016-03-28 NOTE — Progress Notes (Signed)
Office Visit Note   Patient: Paige ReeveDeborah A Alvarez           Date of Birth: 1965-06-24           MRN: [id number] Visit Date: 03/28/2016              Requested by: Blair Heysobert Ehinger, MD [address] [place], [place] [address] PCP: Thora LanceEHINGER,ROBERT R, MD  Chief Complaint  Patient presents with  . Right Shoulder - Pain    Without injury since late summer per pt report    HPI: Pt complains of right shoulder pain. Decreased ROM and decrease in activity (tennis) because of the discomfort. It is getting worse per pt report with pain in the biceps tendon and at time rad to her hand. No n/t and no right sided neck pain. Autumn L Forrest, RMA  This 51 year old woman is seen today for evaluation of right shoulder pain which is been going on for the last 6 months. States is very active over last summer playing tennis and has had total shoulder pain ever since. It is worse at night. pain with reaching above overhead. pain but with reaching behind back. Occasionally the pain radiates down her arm to her hand. Denies any numbness or tingling. No neck pain. Does have difficulty raising arm. No known injury.  Does relate that she "reacts strongly 2 cortisone." Prefers not to have injections of cortisone.    Assessment & Plan: Visit Diagnoses:  1. Acute pain of right shoulder     Plan: she will him take Aleve 2 tablets by mouth twice a day 2 weeks. Have provided a prescription for outpatient physical therapy to take to the him therapy agency of her choice. She will work on range of motion follow-up with us in 4 weeks. Declined steroid injection today.  Follow-Up Instructions: No Follow-up on file.   Exam: Patient is alert and oriented. No adenopathy. Well-dressed. Normal affect. Respirations easy.  Right Shoulder Exam   Tenderness  The patient is experiencing tenderness in the biceps tendon.  Range of Motion  Active Abduction: normal   Muscle Strength  The patient has normal right  shoulder strength.  Tests  Impingement: positive       Imaging: No results found.  Orders:  Orders Placed This Encounter  Procedures  . XR Shoulder Right   No orders of the defined types were placed in this encounter.    Procedures: No procedures performed  Clinical Data: No additional findings.  Subjective: Review of Systems  Constitutional: Negative for chills and fever.    Objective: Vital Signs: Ht 5\' 4"  (1.626 m)   Wt 206 lb (93.4 kg)   BMI 35.36 kg/m   Specialty Comments:  No specialty comments available.  PMFS History: Patient Active Problem List   Diagnosis Date Noted  . Dysuria 07/22/2015  . Microscopic hematuria 07/22/2015  . GERD (gastroesophageal reflux disease) 07/17/2015  . Anxiety 07/17/2015  . Depression 07/17/2015  . Hypercholesteremia 07/17/2015  . IBS (irritable bowel syndrome) 07/17/2015   Past Medical History:  Diagnosis Date  . Anemia    iron deficiency anemia   . Asthma    allergy related   . H/O hiatal hernia    repair with weight loss surgery   . Hypertension   . PONV (postoperative nausea and vomiting)     Family History  Problem Relation Age of Onset  . Cancer Father     prostate  . Liver disease Mother  Past Surgical History:  Procedure Laterality Date  . CHOLECYSTECTOMY N/A 01/12/2014   Procedure: LAPAROSCOPIC CHOLECYSTECTOMY;  Surgeon: Glenna Fellows, MD;  Location: [facility];  Service: General;  Laterality: N/A;  . CHOLECYSTECTOMY  2015  . gastric sleeve wtih hiatal hernia repair     . TONSILLECTOMY    . uterine ablaton      Social History   Occupational History  . Not on file.   Social History Main Topics  . Smoking status: Never Smoker  . Smokeless tobacco: Never Used  . Alcohol use Yes     Comment: rare  . Drug use: No  . Sexual activity: Not on file

## 2016-04-25 ENCOUNTER — Ambulatory Visit (INDEPENDENT_AMBULATORY_CARE_PROVIDER_SITE_OTHER): Payer: BC Managed Care – PPO | Admitting: Orthopedic Surgery

## 2016-09-25 ENCOUNTER — Other Ambulatory Visit: Payer: Self-pay | Admitting: Physician Assistant

## 2016-09-25 DIAGNOSIS — Z1231 Encounter for screening mammogram for malignant neoplasm of breast: Secondary | ICD-10-CM

## 2016-11-07 ENCOUNTER — Ambulatory Visit: Payer: BC Managed Care – PPO

## 2016-11-11 ENCOUNTER — Ambulatory Visit
Admission: RE | Admit: 2016-11-11 | Discharge: 2016-11-11 | Disposition: A | Discharge status: Home / Self Care | Payer: BC Managed Care – PPO | Source: Ambulatory Visit | Attending: Physician Assistant | Admitting: Physician Assistant

## 2016-11-11 DIAGNOSIS — Z1231 Encounter for screening mammogram for malignant neoplasm of breast: Secondary | ICD-10-CM

## 2017-04-03 ENCOUNTER — Encounter: Payer: Self-pay | Admitting: Gastroenterology

## 2017-05-12 ENCOUNTER — Encounter (INDEPENDENT_AMBULATORY_CARE_PROVIDER_SITE_OTHER): Payer: Self-pay

## 2017-05-12 ENCOUNTER — Ambulatory Visit: Payer: BC Managed Care – PPO | Admitting: Gastroenterology

## 2017-05-12 ENCOUNTER — Encounter: Payer: Self-pay | Admitting: Gastroenterology

## 2017-05-12 VITALS — BP 120/80 | HR 78 | Ht 64.0 in | Wt 228.0 lb

## 2017-05-12 DIAGNOSIS — R1011 Right upper quadrant pain: Secondary | ICD-10-CM | POA: Diagnosis not present

## 2017-05-12 DIAGNOSIS — K582 Mixed irritable bowel syndrome: Secondary | ICD-10-CM | POA: Diagnosis not present

## 2017-05-12 DIAGNOSIS — R1310 Dysphagia, unspecified: Secondary | ICD-10-CM

## 2017-05-12 MED ORDER — GLYCOPYRROLATE 2 MG PO TABS: 2.0000 mg | ORAL_TABLET | Freq: Two times a day (BID) | ORAL | 11 refills | Status: DC

## 2017-05-12 NOTE — Patient Instructions (Signed)
Stop taking Donnatal.   We have sent the following medications to your pharmacy for you to pick up at your convenience: glycopyrrolate.   Patient advised to avoid spicy, acidic, citrus, chocolate, mints, fruit and fruit juices.  Limit the intake of caffeine, alcohol and Soda.  Don't exercise too soon after eating.  Don't lie down within 3-4 hours of eating.  Elevate the head of your bed.  You can take your Miralax 1/2 scoop to 2 scoops daily to titrate to have 1-2 bowel movements daily.   You have been given a low gas diet.   You have been scheduled for a Barium Esophogram at [facility] (1st floor of the hospital) on 05/15/17 at 11:00am. Please arrive 15 minutes prior to your appointment for registration. Make certain not to have anything to eat or drink 6 hours prior to your test. If you need to reschedule for any reason, please contact radiology at [phone] to do so. __________________________________________________________________ A barium swallow is an examination that concentrates on views of the esophagus. This tends to be a double contrast exam (barium and two liquids which, when combined, create a gas to distend the wall of the oesophagus) or single contrast (non-ionic iodine based). The study is usually tailored to your symptoms so a good history is essential. Attention is paid during the study to the form, structure and configuration of the esophagus, looking for functional disorders (such as aspiration, dysphagia, achalasia, motility and reflux) EXAMINATION You may be asked to change into a gown, depending on the type of swallow being performed. A radiologist and radiographer will perform the procedure. The radiologist will advise you of the type of contrast selected for your procedure and direct you during the exam. You will be asked to stand, sit or lie in several different positions and to hold a small amount of fluid in your mouth before being asked to swallow while the  imaging is performed .In some instances you may be asked to swallow barium coated marshmallows to assess the motility of a solid food bolus. The exam can be recorded as a digital or video fluoroscopy procedure. POST PROCEDURE It will take 1-2 days for the barium to pass through your system. To facilitate this, it is important, unless otherwise directed, to increase your fluids for the next 24-48hrs and to resume your normal diet.  This test typically takes about 30 minutes to perform. _________________________________________________________________________. Normal BMI (Body Mass Index- based on height and weight) is between 19 and 25. Your BMI today is Body mass index is 39.14 kg/m. Marland Kitchen. Please consider follow up  regarding your BMI with your Primary Care Provider.  Thank you for choosing me and [facility].  Venita LickMalcolm T. Pleas KochStark, Jr., MD., Clementeen GrahamFACG

## 2017-05-12 NOTE — Progress Notes (Signed)
History of Present Illness: This is a 52 year old female referred by Milus Height, PA-C for the evaluation of chronic RUQ pain, diarrhea, constipation, GERD, nausea for 5 years.  She has been followed by Dr. Carman Ching for several years and has had an extensive evaluation.  I reviewed her extensive prior records.  She previously underwent gastric sleeve surgery in [place] in 2013 and all her symptoms started after that.  She underwent cholecystectomy in 2015.  She relates chronic problems with alternating diarrhea and constipation, frequent crampy right upper quadrant pain, reflux symptoms that are not completely controlled with current medications, and intermittent nausea.  Symptom control with Donnatal and omeprazole twice daily has been fair.   Previous evaluation by Dr. Randa Evens included: Abdominal pelvic CT scan in May 2017 that showed 2 small right upper renal pole cysts, eccentric wall thickening in the ascending colon cecal area and a prior cholecystectomy Colonoscopy in May 2017 to the terminal ileum that showed only left colon diverticulosis and internal hemorrhoids.   EGD in May 2017 that showed a prior gastric sleeve surgery mild gastritis with gastric biopsy showing a reactive gastropathy and duodenal biopsies that were normal.   Upper GI series with small bowel follow-through in June 2016 showed a postoperative appearance of the stomach and a small hiatal hernia with intermittent reflux otherwise negative.   She has noted a 15 pound weight gain over the past several months and states she has not been able to exercise because she has not been feeling well.  She has new complaints of difficulty swallowing bread and other starchy foods for about the past 6 months. Denies weight loss, change in stool caliber, melena, hematochezia, vomiting, chest pain.    Allergies  Allergen Reactions  . Other Swelling and Other (See Comments)    "Anything with Mint". Ingested- Swelling. On skin-  Starts to blister.   Marland Kitchen Percocet [Oxycodone-Acetaminophen] Nausea Only   Outpatient Medications Prior to Visit  Medication Sig Dispense Refill  . albuterol (VENTOLIN HFA) 108 (90 BASE) MCG/ACT inhaler Inhale 2 puffs into the lungs every 6 (six) hours as needed for wheezing or shortness of breath.    Marland Kitchen amLODipine (NORVASC) 5 MG tablet Take 5 mg by mouth every morning.    . DONNATAL 16.2 MG tablet Take 1 tablet by mouth 2 (two) times daily.  0  . escitalopram (LEXAPRO) 10 MG tablet   0  . fexofenadine (ALLEGRA) 180 MG tablet Take 180 mg by mouth every morning. Reported on 08/10/2015    . metoprolol succinate (TOPROL-XL) 25 MG 24 hr tablet Take 25 mg by mouth daily.    . montelukast (SINGULAIR) 10 MG tablet Take 10 mg by mouth every morning.    . Multiple Vitamin (MULTIVITAMIN WITH MINERALS) TABS tablet Take 1 tablet by mouth every morning.    . Naproxen Sodium (ALEVE) 220 MG CAPS Take 1 capsule by mouth 2 (two) times daily as needed (pain.). Reported on 08/10/2015    . omeprazole (PRILOSEC) 20 MG capsule Take 20 mg by mouth every morning.    . ondansetron (ZOFRAN) 4 MG tablet Take 4 mg by mouth every 8 (eight) hours as needed for nausea or vomiting.    Marland Kitchen GAVILYTE-N WITH FLAVOR PACK 420 g solution take by mouth as directed  0  . polyethylene glycol powder (GLYCOLAX/MIRALAX) powder Take 17 g by mouth 2 (two) times daily. Until daily soft stools  OTC (Patient not taking: Reported on 05/12/2017) 119 g 0  No facility-administered medications prior to visit.    Past Medical History:  Diagnosis Date  . Allergic rhinitis   . Anemia    iron deficiency anemia   . Anxiety   . Asthma    allergy related   . Diverticulosis   . H/O hiatal hernia    repair with weight loss surgery   . Hypertension   . IBS (irritable bowel syndrome)   . PONV (postoperative nausea and vomiting)   . RLS (restless legs syndrome)    Past Surgical History:  Procedure Laterality Date  . CHOLECYSTECTOMY N/A 01/12/2014    Procedure: LAPAROSCOPIC CHOLECYSTECTOMY;  Surgeon: Glenna FellowsBenjamin Hoxworth, MD;  Location: [facility];  Service: General;  Laterality: N/A;  . gastric sleeve wtih hiatal hernia repair     . TONSILLECTOMY    . uterine ablaton      Social History   Socioeconomic History  . Marital status: Married    Spouse name: None  . Number of children: None  . Years of education: None  . Highest education level: None  Social Needs  . Financial resource strain: None  . Food insecurity - worry: None  . Food insecurity - inability: None  . Transportation needs - medical: None  . Transportation needs - non-medical: None  Occupational History  . None  Tobacco Use  . Smoking status: Never Smoker  . Smokeless tobacco: Never Used  Substance and Sexual Activity  . Alcohol use: Yes    Comment: rare  . Drug use: No  . Sexual activity: None  Other Topics Concern  . None  Social History Narrative  . None   Family History  Problem Relation Age of Onset  . Cancer Father        prostate  . Liver disease Mother   . Breast cancer Paternal Aunt   . Hepatitis C Paternal Grandmother   . Colon cancer Paternal Uncle       Review of Systems: Pertinent positive and negative review of systems were noted in the above HPI section. All other review of systems were otherwise negative.    Physical Exam: General: Well developed, well nourished, no acute distress Head: Normocephalic and atraumatic Eyes:  sclerae anicteric, EOMI Ears: Normal auditory acuity Mouth: No deformity or lesions Neck: Supple, no masses or thyromegaly Lungs: Clear throughout to auscultation Heart: Regular rate and rhythm; no murmurs, rubs or bruits Abdomen: Soft, non tender and non distended. No masses, hepatosplenomegaly or hernias noted. Normal Bowel sounds Rectal: not done Musculoskeletal: Symmetrical with no gross deformities  Skin: No lesions on visible extremities Pulses:  Normal pulses noted Extremities: No clubbing, cyanosis,  edema or deformities noted Neurological: Alert oriented x 4, grossly nonfocal Cervical Nodes:  No significant cervical adenopathy Inguinal Nodes: No significant inguinal adenopathy Psychological:  Alert and cooperative. Normal mood and affect  Assessment and Recommendations:  1. IBS with alternating diarrhea and constipation.  Right upper quadrant plenty likely related to IBS or possibly related to adhesions from prior surgeries. Begin MiraLAX daily and titrate the dosage for adequate bowel movements every day to help reduce the variation from constipation to diarrhea.  Goal is 1-2 formed to semi-formed bowel movements with complete evacuation each day.  Discontinue Donnatal.  Begin glycopyrrolate 2 mg twice daily.  Consider trial of IBGard if glycopyrrolate not helpful. Avoid foods that trigger symptoms.  REV in 6 weeks.   2. GERD.  Continue omeprazole 20 mg daily. Gas-X qid prn.  Continue Zofran 4 mg 3 times daily  as needed.  Closely follow antireflux measures.  Consider increasing omeprazole to 40 mg and/or twice daily dosing at REV.  3. Dysphagia, new onset.  Schedule barium esophagram with tablet.  4. Status post gastric sleeve in 2013.  5. Status post cholecystectomy in 2015.  6. CRC screening, average risk.  A 10-year interval screening colonoscopy is recommended in May 2027.      cc: Redmon, Noelle, PA-C [address] [place], [place] [address]

## 2017-05-15 ENCOUNTER — Ambulatory Visit (HOSPITAL_COMMUNITY): Payer: BC Managed Care – PPO

## 2017-05-22 ENCOUNTER — Telehealth: Payer: Self-pay | Admitting: Gastroenterology

## 2017-05-22 NOTE — Telephone Encounter (Signed)
Patient provided the number to Central scheduling.  She will try to reduce glycopyrrolate to daily and if this does not control her symptoms adequately she will call back for an alternative.  Patient reports that Robinul works great but it is giving her terrible dry mouth.

## 2017-05-22 NOTE — Telephone Encounter (Signed)
Left message for patient to call back  

## 2017-06-01 ENCOUNTER — Ambulatory Visit (INDEPENDENT_AMBULATORY_CARE_PROVIDER_SITE_OTHER): Payer: BC Managed Care – PPO | Admitting: Orthopedic Surgery

## 2017-06-01 ENCOUNTER — Ambulatory Visit (INDEPENDENT_AMBULATORY_CARE_PROVIDER_SITE_OTHER): Payer: Self-pay

## 2017-06-01 ENCOUNTER — Encounter (INDEPENDENT_AMBULATORY_CARE_PROVIDER_SITE_OTHER): Payer: Self-pay | Admitting: Orthopedic Surgery

## 2017-06-01 VITALS — Ht 64.0 in | Wt 228.0 lb

## 2017-06-01 DIAGNOSIS — M6701 Short Achilles tendon (acquired), right ankle: Secondary | ICD-10-CM | POA: Diagnosis not present

## 2017-06-01 DIAGNOSIS — M79671 Pain in right foot: Secondary | ICD-10-CM | POA: Diagnosis not present

## 2017-06-01 NOTE — Progress Notes (Signed)
Office Visit Note   Patient: Paige Alvarez           Date of Birth: 09-Nov-1965           MRN: [id number] Visit Date: 06/01/2017              Requested by: Blair Heys, MD [address] [place], [place] [address] PCP: Blair Heys, MD  Chief Complaint  Patient presents with  . Right Foot - Pain      HPI: Patient is a 52 year old woman who presents with pain over the base of the lateral aspect of the right foot.  Patient has pain with weightbearing relieved with rest.  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  Assessment & Plan: Visit Diagnoses:  1. Pain in right foot   2. Achilles tendon contracture, right     Plan: Patient was given instructions and demonstrated heel cord stretching to do 5 times a day a minute at a time.  Patient was given a prescription for permanent restriction of wearing sneakers at work.  Patient needs to wear either a stiff soled walking sneaker or a Trail running sneaker.  Follow-Up Instructions: Return in about 4 weeks (around 06/29/2017).   Ortho Exam  Patient is alert, oriented, no adenopathy, well-dressed, normal affect, normal respiratory effort. Examination patient has a good dorsalis pedis pulse she has significant heel cord contracture with dorsiflexion about 20 degrees short of neutral with her knee extended.  She is tender to palpation along the fifth metatarsal as well as the base of the fourth metatarsal.  The sinus Tarsi is nontender there is no pain with range of motion of the subtalar joint or ankle.  The peroneal tendons and posterior tibial tendon and the Achilles tendon are nontender to palpation.  Imaging: No results found. No images are attached to the encounter.  Labs: No results found for: HGBA1C, ESRSEDRATE, CRP, LABURIC, REPTSTATUS, GRAMSTAIN, CULT, LABORGA  @LABSALLVALUES (HGBA1)@  Body mass index is 39.14 kg/m.  Orders:  Orders Placed This Encounter  Procedures    . XR Foot Complete Right   No orders of the defined types were placed in this encounter.    Procedures: No procedures performed  Clinical Data: No additional findings.  ROS:  All other systems negative, except as noted in the HPI. Review of Systems  Objective: Vital Signs: Ht 5\' 4"  (1.626 m)   Wt 228 lb (103.4 kg)   BMI 39.14 kg/m   Specialty Comments:  No specialty comments available.  PMFS History: Patient Active Problem List   Diagnosis Date Noted  . Achilles tendon contracture, right 06/01/2017  . Dysuria 07/22/2015  . Microscopic hematuria 07/22/2015  . GERD (gastroesophageal reflux disease) 07/17/2015  . Anxiety 07/17/2015  . Depression 07/17/2015  . Hypercholesteremia 07/17/2015  . IBS (irritable bowel syndrome) 07/17/2015   Past Medical History:  Diagnosis Date  . Allergic rhinitis   . Anemia    iron deficiency anemia   . Anxiety   . Asthma    allergy related   . Diverticulosis   . H/O hiatal hernia    repair with weight loss surgery   . Hypertension   . IBS (irritable bowel syndrome)   . PONV (postoperative nausea and vomiting)   . RLS (restless legs syndrome)     Family History  Problem Relation Age of Onset  . Cancer Father        prostate  .  Liver disease Mother   . Breast cancer Paternal Aunt   . Hepatitis C Paternal Grandmother   . Colon cancer Paternal Uncle     Past Surgical History:  Procedure Laterality Date  . CHOLECYSTECTOMY N/A 01/12/2014   Procedure: LAPAROSCOPIC CHOLECYSTECTOMY;  Surgeon: Glenna FellowsBenjamin Hoxworth, MD;  Location: [facility];  Service: General;  Laterality: N/A;  . gastric sleeve wtih hiatal hernia repair     . TONSILLECTOMY    . uterine ablaton      Social History   Occupational History  . Not on file  Tobacco Use  . Smoking status: Never Smoker  . Smokeless tobacco: Never Used  Substance and Sexual Activity  . Alcohol use: Yes    Comment: rare  . Drug use: No  . Sexual activity: Not on file

## 2017-06-03 ENCOUNTER — Ambulatory Visit
Admission: RE | Admit: 2017-06-03 | Discharge: 2017-06-03 | Disposition: A | Discharge status: Home / Self Care | Payer: BC Managed Care – PPO | Source: Ambulatory Visit | Attending: Gastroenterology | Admitting: Gastroenterology

## 2017-06-03 DIAGNOSIS — R1310 Dysphagia, unspecified: Secondary | ICD-10-CM

## 2017-06-03 DIAGNOSIS — K219 Gastro-esophageal reflux disease without esophagitis: Secondary | ICD-10-CM | POA: Insufficient documentation

## 2017-06-29 ENCOUNTER — Ambulatory Visit: Payer: BC Managed Care – PPO | Admitting: Gastroenterology

## 2017-06-30 ENCOUNTER — Ambulatory Visit (INDEPENDENT_AMBULATORY_CARE_PROVIDER_SITE_OTHER): Payer: BC Managed Care – PPO | Admitting: Orthopedic Surgery

## 2017-07-07 ENCOUNTER — Ambulatory Visit (INDEPENDENT_AMBULATORY_CARE_PROVIDER_SITE_OTHER): Payer: BC Managed Care – PPO | Admitting: Orthopedic Surgery

## 2017-07-21 ENCOUNTER — Ambulatory Visit (INDEPENDENT_AMBULATORY_CARE_PROVIDER_SITE_OTHER): Payer: BC Managed Care – PPO | Admitting: Orthopedic Surgery

## 2017-07-21 ENCOUNTER — Encounter (INDEPENDENT_AMBULATORY_CARE_PROVIDER_SITE_OTHER): Payer: Self-pay | Admitting: Orthopedic Surgery

## 2017-07-21 DIAGNOSIS — M79671 Pain in right foot: Secondary | ICD-10-CM | POA: Diagnosis not present

## 2017-07-21 DIAGNOSIS — M6701 Short Achilles tendon (acquired), right ankle: Secondary | ICD-10-CM | POA: Diagnosis not present

## 2017-07-21 NOTE — Progress Notes (Signed)
Office Visit Note   Patient: Paige Alvarez           Date of Birth: 1965/04/28           MRN: [id number] Visit Date: 07/21/2017              Requested by: Blair Heys, MD [address] [place], [place] [address] PCP: Blair Heys, MD  Chief Complaint  Patient presents with  . Right Foot - Pain, Follow-up      HPI: Patient is a 52 year old woman who presents in follow-up complaining of increasing pain swelling and discoloration around the right ankle.  She states it is painful to touch she complains of tightness she is been having increasing pain on the plantar fascia as well as over the fifth metatarsal head.  She states getting up is painful she states [place] was away at the beach and did a lot of walking on the beach.  Assessment & Plan: Visit Diagnoses:  1. Pain in right foot   2. Achilles tendon contracture, right     Plan: We will draw a uric acid level to see if gout is a component of the ankle swelling and discoloration.  Discussed that if the uric acid is normal we will start her on a low-dose prednisone.  Follow-Up Instructions: Return in about 1 month (around 08/18/2017).   Ortho Exam  Patient is alert, oriented, no adenopathy, well-dressed, normal affect, normal respiratory effort. Examination there is venous stasis swelling in the right lower extremity greater than the left.  There is swelling and skin discoloration around the ankle and is tender to palpation of the ankle the Achilles tendon is tender to palpation the plantar fascia is tender to palpation there is heel cord tightness.  Imaging: No results found. No images are attached to the encounter.  Labs: No results found for: HGBA1C, ESRSEDRATE, CRP, LABURIC, REPTSTATUS, GRAMSTAIN, CULT, LABORGA  No results found for: HGBA1C  There is no height or weight on file to calculate BMI.  Orders:  No orders of the defined types were placed in this encounter.  No orders of the defined types  were placed in this encounter.    Procedures: No procedures performed  Clinical Data: No additional findings.  ROS:  All other systems negative, except as noted in the HPI. Review of Systems  Objective: Vital Signs: There were no vitals taken for this visit.  Specialty Comments:  No specialty comments available.  PMFS History: Patient Active Problem List   Diagnosis Date Noted  . Achilles tendon contracture, right 06/01/2017  . Dysuria 07/22/2015  . Microscopic hematuria 07/22/2015  . GERD (gastroesophageal reflux disease) 07/17/2015  . Anxiety 07/17/2015  . Depression 07/17/2015  . Hypercholesteremia 07/17/2015  . IBS (irritable bowel syndrome) 07/17/2015   Past Medical History:  Diagnosis Date  . Allergic rhinitis   . Anemia    iron deficiency anemia   . Anxiety   . Asthma    allergy related   . Diverticulosis   . H/O hiatal hernia    repair with weight loss surgery   . Hypertension   . IBS (irritable bowel syndrome)   . PONV (postoperative nausea and vomiting)   . RLS (restless legs syndrome)     Family History  Problem Relation Age of Onset  . Cancer Father        prostate  . Liver disease Mother   . Breast cancer Paternal Aunt   . Hepatitis C Paternal Grandmother   .  Colon cancer Paternal Uncle     Past Surgical History:  Procedure Laterality Date  . CHOLECYSTECTOMY N/A 01/12/2014   Procedure: LAPAROSCOPIC CHOLECYSTECTOMY;  Surgeon: Glenna Fellows, MD;  Location: [facility];  Service: General;  Laterality: N/A;  . gastric sleeve wtih hiatal hernia repair     . TONSILLECTOMY    . uterine ablaton      Social History   Occupational History  . Not on file  Tobacco Use  . Smoking status: Never Smoker  . Smokeless tobacco: Never Used  Substance and Sexual Activity  . Alcohol use: Yes    Comment: rare  . Drug use: No  . Sexual activity: Not on file

## 2017-07-22 LAB — URIC ACID: Uric Acid, Serum: 4.8 mg/dL (ref 2.5–7.0)

## 2017-07-23 ENCOUNTER — Telehealth (INDEPENDENT_AMBULATORY_CARE_PROVIDER_SITE_OTHER): Payer: Self-pay | Admitting: Orthopedic Surgery

## 2017-07-23 ENCOUNTER — Other Ambulatory Visit (INDEPENDENT_AMBULATORY_CARE_PROVIDER_SITE_OTHER): Payer: Self-pay | Admitting: Orthopedic Surgery

## 2017-07-23 MED ORDER — PREDNISONE 10 MG PO TABS: 10.0000 mg | ORAL_TABLET | Freq: Every day | ORAL | 1 refills | Status: DC

## 2017-07-23 NOTE — Telephone Encounter (Signed)
Patient called to see if Dr. Lajoyce Corners has her blood results in. She said she thought they would be here yesterday but she has not received a call. Patients # [phone]

## 2017-07-23 NOTE — Telephone Encounter (Signed)
Called pt to advise that uric acid level is WNL and that Dr. Lajoyce Corners gave rx for prednisone to pharm.  Pt states that she is feeling alittle better and that she does not want to take the medication right now but will if the pain flares. She does have an appt for follow up on 08/20/17. Pt will call with any questions or concerns.

## 2017-07-27 ENCOUNTER — Telehealth: Payer: Self-pay | Admitting: Gastroenterology

## 2017-07-27 ENCOUNTER — Other Ambulatory Visit: Payer: Self-pay

## 2017-07-27 ENCOUNTER — Other Ambulatory Visit (INDEPENDENT_AMBULATORY_CARE_PROVIDER_SITE_OTHER): Payer: BC Managed Care – PPO

## 2017-07-27 DIAGNOSIS — R1030 Lower abdominal pain, unspecified: Secondary | ICD-10-CM

## 2017-07-27 DIAGNOSIS — A09 Infectious gastroenteritis and colitis, unspecified: Secondary | ICD-10-CM

## 2017-07-27 LAB — COMPREHENSIVE METABOLIC PANEL
ALBUMIN: 4.2 g/dL (ref 3.5–5.2)
ALT: 42 U/L — AB (ref 0–35)
AST: 38 U/L — AB (ref 0–37)
Alkaline Phosphatase: 76 U/L (ref 39–117)
BILIRUBIN TOTAL: 0.6 mg/dL (ref 0.2–1.2)
BUN: 8 mg/dL (ref 6–23)
CHLORIDE: 96 meq/L (ref 96–112)
CO2: 26 mEq/L (ref 19–32)
CREATININE: 0.54 mg/dL (ref 0.40–1.20)
Calcium: 9.2 mg/dL (ref 8.4–10.5)
GFR: 125.54 mL/min (ref >60.00)
Glucose, Bld: 89 mg/dL (ref 70–99)
Potassium: 3.2 mEq/L — ABNORMAL LOW (ref 3.5–5.1)
SODIUM: 135 meq/L (ref 135–145)
Total Protein: 7.4 g/dL (ref 6.0–8.3)

## 2017-07-27 LAB — CBC WITH DIFFERENTIAL/PLATELET
BASOS PCT: 0.4 % (ref 0.0–3.0)
Basophils Absolute: 0 10*3/uL (ref 0.0–0.1)
Eosinophils Absolute: 0.1 10*3/uL (ref 0.0–0.7)
Eosinophils Relative: 0.8 % (ref 0.0–5.0)
HEMATOCRIT: 41.4 % (ref 36.0–46.0)
HEMOGLOBIN: 14.3 g/dL (ref 12.0–15.0)
Lymphocytes Relative: 19.9 % (ref 12.0–46.0)
Lymphs Abs: 1.9 10*3/uL (ref 0.7–4.0)
MCHC: 34.6 g/dL (ref 30.0–36.0)
MCV: 85.6 fl (ref 78.0–100.0)
Monocytes Absolute: 0.9 10*3/uL (ref 0.1–1.0)
Monocytes Relative: 9.9 % (ref 3.0–12.0)
NEUTROS ABS: 6.4 10*3/uL (ref 1.4–7.7)
Neutrophils Relative %: 69 % (ref 43.0–77.0)
Platelets: 317 10*3/uL (ref 150.0–400.0)
RBC: 4.84 Mil/uL (ref 3.87–5.11)
RDW: 13.7 % (ref 11.5–15.5)
WBC: 9.3 10*3/uL (ref 4.0–10.5)

## 2017-07-27 LAB — TSH: TSH: 1.55 u[IU]/mL (ref 0.35–4.50)

## 2017-07-27 NOTE — Telephone Encounter (Signed)
CBC, CMP, TSH, GI pathogen panel Trial of IBgard 1-2 tid Bland diet for now

## 2017-07-27 NOTE — Telephone Encounter (Signed)
Patient is in agreement with this plan. 

## 2017-07-27 NOTE — Telephone Encounter (Signed)
Spoke with the patient. She began having diarrhea on Friday 07/24/17. She did not take Miralax on Friday Saturday Sunday or today because of the diarrhea. No improvement with Robinul. Her lower abdomen feels uncomfortable. She has had 8 liquid stools today. Feels the urge to go but passes very little. Everything "goes straight through." Afebrile. States stool was blood tinged Saturday.

## 2017-07-28 ENCOUNTER — Telehealth: Payer: Self-pay | Admitting: Gastroenterology

## 2017-07-28 NOTE — Telephone Encounter (Signed)
The pt has been advised that the results have not been reviewed as of today.  We will call her as soon as available.

## 2017-07-28 NOTE — Telephone Encounter (Signed)
Patient wants to know if results from labs done yesterday 5.6.19 are back. Patient states her symptoms have not changed.

## 2017-07-29 ENCOUNTER — Other Ambulatory Visit: Payer: Self-pay

## 2017-07-29 DIAGNOSIS — R197 Diarrhea, unspecified: Secondary | ICD-10-CM

## 2017-07-30 LAB — GASTROINTESTINAL PATHOGEN PANEL PCR
C. DIFFICILE TOX A/B, PCR: UNDETERMINED — AB
CRYPTOSPORIDIUM, PCR: UNDETERMINED — AB
Campylobacter, PCR: UNDETERMINED — AB
E COLI (ETEC) LT/ST, PCR: UNDETERMINED — AB
E COLI (STEC) STX1/STX2, PCR: UNDETERMINED — AB
E coli 0157, PCR: UNDETERMINED — AB
GIARDIA LAMBLIA, PCR: UNDETERMINED — AB
NOROVIRUS, PCR: UNDETERMINED — AB
ROTAVIRUS, PCR: UNDETERMINED — AB
Salmonella, PCR: UNDETERMINED — AB
Shigella, PCR: UNDETERMINED — AB

## 2017-07-31 ENCOUNTER — Telehealth: Payer: Self-pay

## 2017-07-31 ENCOUNTER — Other Ambulatory Visit: Payer: Self-pay

## 2017-07-31 DIAGNOSIS — R197 Diarrhea, unspecified: Secondary | ICD-10-CM

## 2017-07-31 NOTE — Telephone Encounter (Signed)
lmtcb for test results 

## 2017-07-31 NOTE — Telephone Encounter (Signed)
-----   Message from Meryl Dare, MD sent at 07/30/2017  7:17 PM EDT ----- Not sure what happened to make result indeterminate  Please redo GI pathogen panel unless her diarrhea has resolved

## 2017-07-31 NOTE — Telephone Encounter (Signed)
She is still having diarrhea so I have re-ordered the GI pathogen panel.  She will not be able to submit a specimen until Monday the 13th.

## 2017-08-20 ENCOUNTER — Ambulatory Visit (INDEPENDENT_AMBULATORY_CARE_PROVIDER_SITE_OTHER): Payer: BC Managed Care – PPO | Admitting: Orthopedic Surgery

## 2017-08-20 ENCOUNTER — Encounter (INDEPENDENT_AMBULATORY_CARE_PROVIDER_SITE_OTHER): Payer: Self-pay | Admitting: Orthopedic Surgery

## 2017-08-20 DIAGNOSIS — M722 Plantar fascial fibromatosis: Secondary | ICD-10-CM

## 2017-08-20 DIAGNOSIS — M6701 Short Achilles tendon (acquired), right ankle: Secondary | ICD-10-CM | POA: Diagnosis not present

## 2017-08-20 DIAGNOSIS — M79671 Pain in right foot: Secondary | ICD-10-CM

## 2017-08-20 NOTE — Progress Notes (Signed)
Office Visit Note   Patient: Paige Alvarez           Date of Birth: 06/14/1965           MRN: [id number] Visit Date: 08/20/2017              Requested by: Blair Heys, MD [address] [place], [place] [address] PCP: Blair Heys, MD  Chief Complaint  Patient presents with  . Right Foot - Follow-up, Pain      HPI: Patient is a 52 year old woman who has had several month history of pain from the Achilles contracture with insertional Achilles tendinitis plantar fasciitis and lateral foot pain.  Patient states she has tried for several months for stretching her Achilles but has been unsuccessful.  She has pain with activities of daily living cannot perform any types of sports and cannot play tennis.  Assessment & Plan: Visit Diagnoses:  1. Achilles tendon contracture, right   2. Pain in right foot   3. Plantar fasciitis, right     Plan: Discussed that with her failure conservative care or treatment option would be a gastrocnemius recession.  Discussed that there are risks with surgery which include wound dehiscence persistent tightness numbness in the sural nerve distribution also risk of infection and DVT.  Patient states she understands states that she has tried is much as she can with conservative therapy and would like to proceed with surgical intervention.  We will call to set this up at her convenience.  Follow-Up Instructions: Return if symptoms worsen or fail to improve.   Ortho Exam  Patient is alert, oriented, no adenopathy, well-dressed, normal affect, normal respiratory effort. Examination patient has good pulses.  She has no nodular changes to the Achilles she has tenderness now to palpation over the origin the plantar fascia she is tender to palpation of the base of the fifth metatarsal she has exquisite heel cord contracture with the knee extended her dorsiflexion is 20 degrees short of neutral with her knee flexed she has dorsiflexion about 30  degrees past neutral.  Imaging: No results found. No images are attached to the encounter.  Labs: Lab Results  Component Value Date   LABURIC 4.8 07/21/2017     Lab Results  Component Value Date   ALBUMIN 4.2 07/27/2017   ALBUMIN 4.1 01/14/2014   ALBUMIN 3.9 05/01/2011   LABURIC 4.8 07/21/2017    There is no height or weight on file to calculate BMI.  Orders:  No orders of the defined types were placed in this encounter.  No orders of the defined types were placed in this encounter.    Procedures: No procedures performed  Clinical Data: No additional findings.  ROS:  All other systems negative, except as noted in the HPI. Review of Systems  Objective: Vital Signs: There were no vitals taken for this visit.  Specialty Comments:  No specialty comments available.  PMFS History: Patient Active Problem List   Diagnosis Date Noted  . Achilles tendon contracture, right 06/01/2017  . Dysuria 07/22/2015  . Microscopic hematuria 07/22/2015  . GERD (gastroesophageal reflux disease) 07/17/2015  . Anxiety 07/17/2015  . Depression 07/17/2015  . Hypercholesteremia 07/17/2015  . IBS (irritable bowel syndrome) 07/17/2015   Past Medical History:  Diagnosis Date  . Allergic rhinitis   . Anemia    iron deficiency anemia   . Anxiety   . Asthma    allergy related   . Diverticulosis   . H/O hiatal  hernia    repair with weight loss surgery   . Hypertension   . IBS (irritable bowel syndrome)   . PONV (postoperative nausea and vomiting)   . RLS (restless legs syndrome)     Family History  Problem Relation Age of Onset  . Cancer Father        prostate  . Liver disease Mother   . Breast cancer Paternal Aunt   . Hepatitis C Paternal Grandmother   . Colon cancer Paternal Uncle     Past Surgical History:  Procedure Laterality Date  . CHOLECYSTECTOMY N/A 01/12/2014   Procedure: LAPAROSCOPIC CHOLECYSTECTOMY;  Surgeon: Glenna Fellows, MD;  Location: [facility];   Service: General;  Laterality: N/A;  . gastric sleeve wtih hiatal hernia repair     . TONSILLECTOMY    . uterine ablaton      Social History   Occupational History  . Not on file  Tobacco Use  . Smoking status: Never Smoker  . Smokeless tobacco: Never Used  Substance and Sexual Activity  . Alcohol use: Yes    Comment: rare  . Drug use: No  . Sexual activity: Not on file

## 2017-08-31 ENCOUNTER — Telehealth (INDEPENDENT_AMBULATORY_CARE_PROVIDER_SITE_OTHER): Payer: Self-pay | Admitting: Orthopedic Surgery

## 2017-08-31 NOTE — Telephone Encounter (Signed)
Patient called stated she needs a date for surgery. She has reservations for a tip and trying to make sure she can make this trip.  Please call her to schedule surgery. She is upset she has nor

## 2017-09-02 NOTE — Telephone Encounter (Signed)
I spoke with Paige Alvarez. We scheduled surgery for 09/08/2017.

## 2017-09-08 DIAGNOSIS — M6701 Short Achilles tendon (acquired), right ankle: Secondary | ICD-10-CM | POA: Diagnosis not present

## 2017-09-14 ENCOUNTER — Telehealth (INDEPENDENT_AMBULATORY_CARE_PROVIDER_SITE_OTHER): Payer: Self-pay | Admitting: Orthopedic Surgery

## 2017-09-14 ENCOUNTER — Ambulatory Visit (INDEPENDENT_AMBULATORY_CARE_PROVIDER_SITE_OTHER): Payer: BC Managed Care – PPO | Admitting: Orthopaedic Surgery

## 2017-09-14 ENCOUNTER — Encounter (INDEPENDENT_AMBULATORY_CARE_PROVIDER_SITE_OTHER): Payer: Self-pay | Admitting: Orthopaedic Surgery

## 2017-09-14 DIAGNOSIS — M6701 Short Achilles tendon (acquired), right ankle: Secondary | ICD-10-CM

## 2017-09-14 MED ORDER — SULFAMETHOXAZOLE-TRIMETHOPRIM 800-160 MG PO TABS: 1.0000 | ORAL_TABLET | Freq: Two times a day (BID) | ORAL | 0 refills | Status: DC

## 2017-09-14 NOTE — Progress Notes (Signed)
The patient comes in today to be seen 1 week out from surgery by Dr. Lajoyce Cornersuda which was a right gastroc recession.  She is leaving for the beach on Wednesday and will make sure that everything was looking okay since there was just some slight redness and drainage.  She has not been sick feeling and not had any increased pain.  On exam I gave her reassurance that nothing really looks infected.  I do feel that she has just some slight redness only from the sutures themselves and how sutures can react to skin.  She has just some slight drainage but this just appears to be healing.  She can continue to get a wet and shower daily with soapy water running down her leg.  She will then dried off for well and can place a thin layer of antibiotic ointment on the incision.  Since she is leaving for the beach I do not mind sending in antibiotics as a precaution.  We will send in Bactrim DS.  She will keep a follow-up with Dr. Lajoyce Cornersuda next week.  Again I gave her reassurance that this is not appeared bad at all.  She of note denies any fever or chills or increased pain.  Her calf is otherwise soft as well.

## 2017-09-14 NOTE — Telephone Encounter (Signed)
Sent to triage, incision problem

## 2017-09-21 ENCOUNTER — Ambulatory Visit (INDEPENDENT_AMBULATORY_CARE_PROVIDER_SITE_OTHER): Payer: BC Managed Care – PPO | Admitting: Orthopedic Surgery

## 2017-09-21 ENCOUNTER — Encounter (INDEPENDENT_AMBULATORY_CARE_PROVIDER_SITE_OTHER): Payer: Self-pay | Admitting: Orthopedic Surgery

## 2017-09-21 DIAGNOSIS — M6701 Short Achilles tendon (acquired), right ankle: Secondary | ICD-10-CM

## 2017-09-21 NOTE — Progress Notes (Signed)
Office Visit Note   Patient: Paige Alvarez           Date of Birth: [birth year]           MRN: [id number] Visit Date: 09/21/2017              Requested by: Blair Heys, MD [address] [place], [place] [address] PCP: Blair Heys, MD  Chief Complaint  Patient presents with  . Right Ankle - Routine Post Op    DOS 09/08/17- Right Gastroc Recession      HPI: Patient is a 52 year old woman who presents 2 weeks status post gastrocnemius recession.  Patient states she has been at the beach and has been ambulating well up and down stairs has trouble going downstairs.  Assessment & Plan: Visit Diagnoses:  1. Achilles tendon contracture, right     Plan: Sutures are harvested recommend compression and scar massage.  Follow-Up Instructions: Return in about 1 month (around 10/19/2017).   Ortho Exam  Patient is alert, oriented, no adenopathy, well-dressed, normal affect, normal respiratory effort. Examination there is some mild redness around the sutures there is no cellulitis no drainage no signs of infection patient has excellent dorsiflexion of her ankle.  Imaging: No results found. No images are attached to the encounter.  Labs: Lab Results  Component Value Date   LABURIC 4.8 07/21/2017     Lab Results  Component Value Date   ALBUMIN 4.2 07/27/2017   ALBUMIN 4.1 01/14/2014   ALBUMIN 3.9 05/01/2011   LABURIC 4.8 07/21/2017    There is no height or weight on file to calculate BMI.  Orders:  No orders of the defined types were placed in this encounter.  No orders of the defined types were placed in this encounter.    Procedures: No procedures performed  Clinical Data: No additional findings.  ROS:  All other systems negative, except as noted in the HPI. Review of Systems  Objective: Vital Signs: There were no vitals taken for this visit.  Specialty Comments:  No specialty comments available.  PMFS History: Patient Active  Problem List   Diagnosis Date Noted  . Achilles tendon contracture, right 06/01/2017  . Dysuria 07/22/2015  . Microscopic hematuria 07/22/2015  . GERD (gastroesophageal reflux disease) 07/17/2015  . Anxiety 07/17/2015  . Depression 07/17/2015  . Hypercholesteremia 07/17/2015  . IBS (irritable bowel syndrome) 07/17/2015   Past Medical History:  Diagnosis Date  . Allergic rhinitis   . Anemia    iron deficiency anemia   . Anxiety   . Asthma    allergy related   . Diverticulosis   . H/O hiatal hernia    repair with weight loss surgery   . Hypertension   . IBS (irritable bowel syndrome)   . PONV (postoperative nausea and vomiting)   . RLS (restless legs syndrome)     Family History  Problem Relation Age of Onset  . Cancer Father        prostate  . Liver disease Mother   . Breast cancer Paternal Aunt   . Hepatitis C Paternal Grandmother   . Colon cancer Paternal Uncle     Past Surgical History:  Procedure Laterality Date  . CHOLECYSTECTOMY N/A 01/12/2014   Procedure: LAPAROSCOPIC CHOLECYSTECTOMY;  Surgeon: Glenna Fellows, MD;  Location: [facility];  Service: General;  Laterality: N/A;  . gastric sleeve wtih hiatal hernia repair     . TONSILLECTOMY    . uterine ablaton  Social History   Occupational History  . Not on file  Tobacco Use  . Smoking status: Never Smoker  . Smokeless tobacco: Never Used  Substance and Sexual Activity  . Alcohol use: Yes    Comment: rare  . Drug use: No  . Sexual activity: Not on file

## 2017-10-19 ENCOUNTER — Ambulatory Visit (INDEPENDENT_AMBULATORY_CARE_PROVIDER_SITE_OTHER): Payer: BC Managed Care – PPO | Admitting: Orthopedic Surgery

## 2017-10-22 ENCOUNTER — Ambulatory Visit (INDEPENDENT_AMBULATORY_CARE_PROVIDER_SITE_OTHER): Payer: BC Managed Care – PPO | Admitting: Orthopedic Surgery

## 2017-10-22 ENCOUNTER — Encounter (INDEPENDENT_AMBULATORY_CARE_PROVIDER_SITE_OTHER): Payer: Self-pay | Admitting: Orthopedic Surgery

## 2017-10-22 VITALS — Ht 64.0 in | Wt 228.0 lb

## 2017-10-22 DIAGNOSIS — M6701 Short Achilles tendon (acquired), right ankle: Secondary | ICD-10-CM

## 2017-10-22 NOTE — Progress Notes (Signed)
Office Visit Note   Patient: Paige Alvarez           Date of Birth: Dec 18, 1965           MRN: [id number] Visit Date: 10/22/2017              Requested by: Blair Heys, MD [address] [place], [place] [address] PCP: Blair Heys, MD  Chief Complaint  Patient presents with  . Right Leg - Routine Post Op    09/08/17 right gastroc recession       HPI: Patient is a 52 year old woman who is about 6 weeks status post right gastrocnemius recession.  Patient states she was doing great she stepped on a rock while walking her horse and had acute dorsiflexion of the foot and acute pain in the calf.  She states the pain is improving and the swelling is improving.  Assessment & Plan: Visit Diagnoses:  1. Achilles tendon contracture, right     Plan: Continue with Achilles stretching activities as tolerated no restrictions.  Follow-Up Instructions: Return if symptoms worsen or fail to improve.   Ortho Exam  Patient is alert, oriented, no adenopathy, well-dressed, normal affect, normal respiratory effort. Examination patient does have increased swelling the right lower extremity compared to the left.  The incision is well-healed there is no palpable defect in the calf she has excellent dorsiflexion of her ankle 30 degrees past neutral.  The Achilles tendon is intact no palpable defects she does have a mild Haglund's deformity.  Imaging: No results found. No images are attached to the encounter.  Labs: Lab Results  Component Value Date   LABURIC 4.8 07/21/2017     Lab Results  Component Value Date   ALBUMIN 4.2 07/27/2017   ALBUMIN 4.1 01/14/2014   ALBUMIN 3.9 05/01/2011   LABURIC 4.8 07/21/2017    Body mass index is 39.14 kg/m.  Orders:  No orders of the defined types were placed in this encounter.  No orders of the defined types were placed in this encounter.    Procedures: No procedures performed  Clinical Data: No additional  findings.  ROS:  All other systems negative, except as noted in the HPI. Review of Systems  Objective: Vital Signs: Ht 5\' 4"  (1.626 m)   Wt 228 lb (103.4 kg)   BMI 39.14 kg/m   Specialty Comments:  No specialty comments available.  PMFS History: Patient Active Problem List   Diagnosis Date Noted  . Achilles tendon contracture, right 06/01/2017  . Dysuria 07/22/2015  . Microscopic hematuria 07/22/2015  . GERD (gastroesophageal reflux disease) 07/17/2015  . Anxiety 07/17/2015  . Depression 07/17/2015  . Hypercholesteremia 07/17/2015  . IBS (irritable bowel syndrome) 07/17/2015   Past Medical History:  Diagnosis Date  . Allergic rhinitis   . Anemia    iron deficiency anemia   . Anxiety   . Asthma    allergy related   . Diverticulosis   . H/O hiatal hernia    repair with weight loss surgery   . Hypertension   . IBS (irritable bowel syndrome)   . PONV (postoperative nausea and vomiting)   . RLS (restless legs syndrome)     Family History  Problem Relation Age of Onset  . Cancer Father        prostate  . Liver disease Mother   . Breast cancer Paternal Aunt   . Hepatitis C Paternal Grandmother   . Colon cancer Paternal Uncle  Past Surgical History:  Procedure Laterality Date  . CHOLECYSTECTOMY N/A 01/12/2014   Procedure: LAPAROSCOPIC CHOLECYSTECTOMY;  Surgeon: Glenna FellowsBenjamin Hoxworth, MD;  Location: [facility];  Service: General;  Laterality: N/A;  . gastric sleeve wtih hiatal hernia repair     . TONSILLECTOMY    . uterine ablaton      Social History   Occupational History  . Not on file  Tobacco Use  . Smoking status: Never Smoker  . Smokeless tobacco: Never Used  Substance and Sexual Activity  . Alcohol use: Yes    Comment: rare  . Drug use: No  . Sexual activity: Not on file

## 2017-11-05 ENCOUNTER — Ambulatory Visit (INDEPENDENT_AMBULATORY_CARE_PROVIDER_SITE_OTHER): Payer: BC Managed Care – PPO | Admitting: Orthopedic Surgery

## 2017-11-13 ENCOUNTER — Other Ambulatory Visit: Payer: Self-pay | Admitting: Physician Assistant

## 2017-11-13 DIAGNOSIS — Z1231 Encounter for screening mammogram for malignant neoplasm of breast: Secondary | ICD-10-CM

## 2017-12-02 ENCOUNTER — Ambulatory Visit (INDEPENDENT_AMBULATORY_CARE_PROVIDER_SITE_OTHER): Payer: BC Managed Care – PPO | Admitting: Orthopedic Surgery

## 2017-12-02 ENCOUNTER — Encounter (INDEPENDENT_AMBULATORY_CARE_PROVIDER_SITE_OTHER): Payer: Self-pay | Admitting: Family

## 2017-12-02 VITALS — Ht 64.0 in | Wt 228.0 lb

## 2017-12-02 DIAGNOSIS — M722 Plantar fascial fibromatosis: Secondary | ICD-10-CM

## 2017-12-02 DIAGNOSIS — M6701 Short Achilles tendon (acquired), right ankle: Secondary | ICD-10-CM

## 2017-12-02 NOTE — Progress Notes (Signed)
Office Visit Note   Patient: Paige Alvarez           Date of Birth: 1965-07-21           MRN: [id number] Visit Date: 12/02/2017              Requested by: Blair Heys, MD [address] [place], [place] [address] PCP: Blair Heys, MD  Chief Complaint  Patient presents with  . Right Foot - Pain      HPI: Patient is a 52 year old woman who presents in follow-up with recurrent plantar fasciitis on the right.  She is 3 months status post gastrocnemius recession and plantar fascial release she states she initially had good relief but recently she was on her feet all day at work also then had to work on her feet after work and after prolonged standing had increasing pain.  Patient presents at this time and flip-flops.  She is not interested in an injection.  Assessment & Plan: Visit Diagnoses:  1. Achilles tendon contracture, right   2. Plantar fasciitis, right     Plan: Patient will continue with her Achilles stretching recommended a stiff soled Trail running sneaker like a [employer] as well as good supportive orthotics like sole orthotics.  Reevaluate in 4 weeks.  Follow-Up Instructions: Return in about 4 weeks (around 12/30/2017).   Ortho Exam  Patient is alert, oriented, no adenopathy, well-dressed, normal affect, normal respiratory effort. Examination patient has good pulses she has good dorsiflexion of the ankle after the gastrocnemius recession.  She has no tenderness to palpation of the Achilles lateral compression of the calcaneus is nontender she is point tender to palpation of the origin the plantar fascia.  Imaging: No results found. No images are attached to the encounter.  Labs: Lab Results  Component Value Date   LABURIC 4.8 07/21/2017     Lab Results  Component Value Date   ALBUMIN 4.2 07/27/2017   ALBUMIN 4.1 01/14/2014   ALBUMIN 3.9 05/01/2011   LABURIC 4.8 07/21/2017    Body mass index is 39.14 kg/m.  Orders:  No orders  of the defined types were placed in this encounter.  No orders of the defined types were placed in this encounter.    Procedures: No procedures performed  Clinical Data: No additional findings.  ROS:  All other systems negative, except as noted in the HPI. Review of Systems  Objective: Vital Signs: Ht 5\' 4"  (1.626 m)   Wt 228 lb (103.4 kg)   BMI 39.14 kg/m   Specialty Comments:  No specialty comments available.  PMFS History: Patient Active Problem List   Diagnosis Date Noted  . Achilles tendon contracture, right 06/01/2017  . Dysuria 07/22/2015  . Microscopic hematuria 07/22/2015  . GERD (gastroesophageal reflux disease) 07/17/2015  . Anxiety 07/17/2015  . Depression 07/17/2015  . Hypercholesteremia 07/17/2015  . IBS (irritable bowel syndrome) 07/17/2015   Past Medical History:  Diagnosis Date  . Allergic rhinitis   . Anemia    iron deficiency anemia   . Anxiety   . Asthma    allergy related   . Diverticulosis   . H/O hiatal hernia    repair with weight loss surgery   . Hypertension   . IBS (irritable bowel syndrome)   . PONV (postoperative nausea and vomiting)   . RLS (restless legs syndrome)     Family History  Problem Relation Age of Onset  . Cancer Father  prostate  . Liver disease Mother   . Breast cancer Paternal Aunt   . Hepatitis C Paternal Grandmother   . Colon cancer Paternal Uncle     Past Surgical History:  Procedure Laterality Date  . CHOLECYSTECTOMY N/A 01/12/2014   Procedure: LAPAROSCOPIC CHOLECYSTECTOMY;  Surgeon: Glenna Fellows, MD;  Location: [facility];  Service: General;  Laterality: N/A;  . gastric sleeve wtih hiatal hernia repair     . TONSILLECTOMY    . uterine ablaton      Social History   Occupational History  . Not on file  Tobacco Use  . Smoking status: Never Smoker  . Smokeless tobacco: Never Used  Substance and Sexual Activity  . Alcohol use: Yes    Comment: rare  . Drug use: No  . Sexual activity: Not  on file

## 2017-12-14 ENCOUNTER — Ambulatory Visit
Admission: RE | Admit: 2017-12-14 | Discharge: 2017-12-14 | Disposition: A | Discharge status: Home / Self Care | Payer: BC Managed Care – PPO | Source: Ambulatory Visit | Attending: Physician Assistant | Admitting: Physician Assistant

## 2017-12-14 DIAGNOSIS — Z1231 Encounter for screening mammogram for malignant neoplasm of breast: Secondary | ICD-10-CM

## 2017-12-30 ENCOUNTER — Ambulatory Visit (INDEPENDENT_AMBULATORY_CARE_PROVIDER_SITE_OTHER): Payer: BC Managed Care – PPO | Admitting: Orthopedic Surgery

## 2018-03-03 ENCOUNTER — Ambulatory Visit (INDEPENDENT_AMBULATORY_CARE_PROVIDER_SITE_OTHER): Payer: BC Managed Care – PPO | Admitting: Orthopaedic Surgery

## 2018-03-03 ENCOUNTER — Encounter (INDEPENDENT_AMBULATORY_CARE_PROVIDER_SITE_OTHER): Payer: Self-pay | Admitting: Orthopaedic Surgery

## 2018-03-03 ENCOUNTER — Ambulatory Visit (INDEPENDENT_AMBULATORY_CARE_PROVIDER_SITE_OTHER): Payer: Self-pay

## 2018-03-03 DIAGNOSIS — M79642 Pain in left hand: Secondary | ICD-10-CM | POA: Diagnosis not present

## 2018-03-03 MED ORDER — TRAMADOL HCL 50 MG PO TABS: 50.0000 mg | ORAL_TABLET | Freq: Four times a day (QID) | ORAL | 0 refills | Status: DC | PRN

## 2018-03-03 NOTE — Progress Notes (Signed)
Office Visit Note   Patient: Paige Alvarez           Date of Birth: 17-Oct-1965           MRN: [id number] Visit Date: 03/03/2018              Requested by: Blair HeysEhinger, Robert, MD [address] [place], [place] [id number] PCP: Blair HeysEhinger, Robert, MD   Assessment & Plan: Visit Diagnoses:  1. Pain in left hand     Plan: I gave her reassurance that I did not see any fracture.  I do feel a removable wrist splint can help at least protect the area as she is healing.  We will try a 6-day steroid taper and some tramadol and she will continue her Aleve.  Follow-up will be as needed unless things are worsening in any way.  All question concerns were answered and addressed.  Follow-Up Instructions: Return if symptoms worsen or fail to improve.   Orders:  Orders Placed This Encounter  Procedures  . XR Hand Complete Left   Meds ordered this encounter  Medications  . traMADol (ULTRAM) 50 MG tablet    Sig: Take 1-2 tablets (50-100 mg total) by mouth every 6 (six) hours as needed.    Dispense:  40 tablet    Refill:  0      Procedures: No procedures performed   Clinical Data: No additional findings.   Subjective: Chief Complaint  Patient presents with  . Left Hand - Pain  The patient comes in today for evaluation treatment of an acute left hand injury.  This past weekend she had her left hand caught in a dog collar and it twisted and pulled against her hand on Saturday.  She is in a lot of pain on the lateral aspect of her left hand over the fourth and fifth rays.  She says it feels like is going to sleep as well.  She is been taking Aleve.  She works as a Runner, broadcasting/film/videoteacher.  We have seen her before for several other things.  She is not a diabetic.  She has never injured this hand before and has not had any issues with numbness and tingling before.  HPI  Review of Systems She currently denies any fever, chills, nausea, vomiting, chest pain, shortness of breath  Objective: Vital  Signs: There were no vitals taken for this visit.  Physical Exam She is alert and orient x3 and in no acute distress Ortho Exam Examination of her left hand shows no swelling.  She can make a full fist and straighten her fingers and abduct and adduct the fingers.  She is neurovascular intact.  She has pain when I compress her hand along the fifth ray.  She has pain to palpation along the fifth ray. Specialty Comments:  No specialty comments available.  Imaging: Xr Hand Complete Left  Result Date: 03/03/2018 3 views of the left hand show no acute findings.  There is no evidence of fracture or dislocation.    PMFS History: Patient Active Problem List   Diagnosis Date Noted  . Achilles tendon contracture, right 06/01/2017  . Dysuria 07/22/2015  . Microscopic hematuria 07/22/2015  . GERD (gastroesophageal reflux disease) 07/17/2015  . Anxiety 07/17/2015  . Depression 07/17/2015  . Hypercholesteremia 07/17/2015  . IBS (irritable bowel syndrome) 07/17/2015   Past Medical History:  Diagnosis Date  . Allergic rhinitis   . Anemia    iron deficiency anemia   . Anxiety   .  Asthma    allergy related   . Diverticulosis   . H/O hiatal hernia    repair with weight loss surgery   . Hypertension   . IBS (irritable bowel syndrome)   . PONV (postoperative nausea and vomiting)   . RLS (restless legs syndrome)     Family History  Problem Relation Age of Onset  . Cancer Father        prostate  . Liver disease Mother   . Breast cancer Paternal Aunt   . Hepatitis C Paternal Grandmother   . Colon cancer Paternal Uncle     Past Surgical History:  Procedure Laterality Date  . CHOLECYSTECTOMY N/A 01/12/2014   Procedure: LAPAROSCOPIC CHOLECYSTECTOMY;  Surgeon: Glenna Fellows, MD;  Location: [facility];  Service: General;  Laterality: N/A;  . gastric sleeve wtih hiatal hernia repair     . TONSILLECTOMY    . uterine ablaton      Social History   Occupational History  . Not on file    Tobacco Use  . Smoking status: Never Smoker  . Smokeless tobacco: Never Used  Substance and Sexual Activity  . Alcohol use: Yes    Comment: rare  . Drug use: No  . Sexual activity: Not on file

## 2018-04-12 ENCOUNTER — Other Ambulatory Visit (HOSPITAL_COMMUNITY): Payer: Self-pay | Admitting: Physician Assistant

## 2018-04-12 ENCOUNTER — Other Ambulatory Visit: Payer: Self-pay | Admitting: Physician Assistant

## 2018-04-12 ENCOUNTER — Other Ambulatory Visit (HOSPITAL_COMMUNITY)
Admission: RE | Admit: 2018-04-12 | Discharge: 2018-04-12 | Disposition: A | Discharge status: Home / Self Care | Payer: BC Managed Care – PPO | Source: Ambulatory Visit | Attending: Family Medicine | Admitting: Family Medicine

## 2018-04-12 DIAGNOSIS — Z01419 Encounter for gynecological examination (general) (routine) without abnormal findings: Secondary | ICD-10-CM | POA: Diagnosis not present

## 2018-04-12 DIAGNOSIS — R079 Chest pain, unspecified: Secondary | ICD-10-CM

## 2018-04-13 ENCOUNTER — Telehealth (HOSPITAL_COMMUNITY): Payer: Self-pay

## 2018-04-13 NOTE — Telephone Encounter (Signed)
Encounter complete. 

## 2018-04-13 NOTE — Telephone Encounter (Signed)
Geisla notified to cancel patient appointment. Encounter complete.

## 2018-04-14 ENCOUNTER — Ambulatory Visit (HOSPITAL_COMMUNITY): Payer: BC Managed Care – PPO

## 2018-04-15 LAB — CYTOLOGY - PAP: Diagnosis: NEGATIVE

## 2018-04-18 ENCOUNTER — Encounter (HOSPITAL_COMMUNITY): Payer: Self-pay | Admitting: Emergency Medicine

## 2018-04-18 ENCOUNTER — Emergency Department (HOSPITAL_COMMUNITY): Payer: BC Managed Care – PPO

## 2018-04-18 ENCOUNTER — Emergency Department (HOSPITAL_COMMUNITY)
Admission: EM | Admit: 2018-04-18 | Discharge: 2018-04-18 | Disposition: A | Discharge status: Home / Self Care | Payer: BC Managed Care – PPO | Attending: Emergency Medicine | Admitting: Emergency Medicine

## 2018-04-18 ENCOUNTER — Other Ambulatory Visit: Payer: Self-pay

## 2018-04-18 DIAGNOSIS — J45909 Unspecified asthma, uncomplicated: Secondary | ICD-10-CM | POA: Insufficient documentation

## 2018-04-18 DIAGNOSIS — H81399 Other peripheral vertigo, unspecified ear: Secondary | ICD-10-CM | POA: Diagnosis not present

## 2018-04-18 DIAGNOSIS — I10 Essential (primary) hypertension: Secondary | ICD-10-CM | POA: Insufficient documentation

## 2018-04-18 DIAGNOSIS — Z79899 Other long term (current) drug therapy: Secondary | ICD-10-CM | POA: Insufficient documentation

## 2018-04-18 DIAGNOSIS — R51 Headache: Secondary | ICD-10-CM | POA: Insufficient documentation

## 2018-04-18 DIAGNOSIS — R110 Nausea: Secondary | ICD-10-CM | POA: Insufficient documentation

## 2018-04-18 DIAGNOSIS — R42 Dizziness and giddiness: Secondary | ICD-10-CM | POA: Diagnosis present

## 2018-04-18 LAB — COMPREHENSIVE METABOLIC PANEL
ALT: 46 U/L — ABNORMAL HIGH (ref 0–44)
AST: 34 U/L (ref 15–41)
Albumin: 4.2 g/dL (ref 3.5–5.0)
Alkaline Phosphatase: 56 U/L (ref 38–126)
Anion gap: 11 (ref 5–15)
BUN: 8 mg/dL (ref 6–20)
CHLORIDE: 104 mmol/L (ref 98–111)
CO2: 26 mmol/L (ref 22–32)
Calcium: 9.5 mg/dL (ref 8.9–10.3)
Creatinine, Ser: 0.63 mg/dL (ref 0.44–1.00)
GFR calc Af Amer: >60 mL/min (ref >60)
GFR calc non Af Amer: >60 mL/min (ref >60)
GLUCOSE: 96 mg/dL (ref 70–99)
Potassium: 4.2 mmol/L (ref 3.5–5.1)
Sodium: 141 mmol/L (ref 135–145)
Total Bilirubin: 0.7 mg/dL (ref 0.3–1.2)
Total Protein: 6.9 g/dL (ref 6.5–8.1)

## 2018-04-18 LAB — CBC WITH DIFFERENTIAL/PLATELET
Abs Immature Granulocytes: 0.01 10*3/uL (ref 0.00–0.07)
Basophils Absolute: 0 10*3/uL (ref 0.0–0.1)
Basophils Relative: 1 %
EOS ABS: 0.2 10*3/uL (ref 0.0–0.5)
Eosinophils Relative: 2 %
HCT: 43.5 % (ref 36.0–46.0)
Hemoglobin: 14.3 g/dL (ref 12.0–15.0)
Immature Granulocytes: 0 %
Lymphocytes Relative: 37 %
Lymphs Abs: 2.4 10*3/uL (ref 0.7–4.0)
MCH: 29.2 pg (ref 26.0–34.0)
MCHC: 32.9 g/dL (ref 30.0–36.0)
MCV: 88.8 fL (ref 80.0–100.0)
Monocytes Absolute: 0.5 10*3/uL (ref 0.1–1.0)
Monocytes Relative: 8 %
NEUTROS PCT: 52 %
Neutro Abs: 3.5 10*3/uL (ref 1.7–7.7)
PLATELETS: 285 10*3/uL (ref 150–400)
RBC: 4.9 MIL/uL (ref 3.87–5.11)
RDW: 13.2 % (ref 11.5–15.5)
WBC: 6.7 10*3/uL (ref 4.0–10.5)
nRBC: 0 % (ref 0.0–0.2)

## 2018-04-18 MED ORDER — MECLIZINE HCL 25 MG PO TABS: 25.0000 mg | ORAL_TABLET | Freq: Three times a day (TID) | ORAL | 0 refills | Status: DC | PRN

## 2018-04-18 MED ORDER — MECLIZINE HCL 25 MG PO TABS
25.0000 mg | ORAL_TABLET | Freq: Once | ORAL | Status: AC
  Administered 2018-04-18: 25 mg via ORAL
  Filled 2018-04-18: qty 1

## 2018-04-18 NOTE — ED Notes (Signed)
Patient transported to MRI 

## 2018-04-18 NOTE — ED Provider Notes (Signed)
[facility] EMERGENCY DEPARTMENT Provider Note   CSN: [id number] Arrival date & time: 04/18/18  1558     History   Chief Complaint Chief Complaint  Patient presents with  . Dizziness    HPI Paige Alvarez is a 53 y.o. female.  HPI Patient states she developed sensation of "feeling like she is on a boat" 4 days ago.  This worsened today.  States the symptoms are worse when she looks or turns her head to the right.  Is associated with nausea and mild headache.  Was seen at urgent care clinic and due to concern for possible CVA was sent to the emergency department for evaluation.  Patient denies any numbness but states she does have some paresthesias to her face.  Denies any focal weakness.  Denies any gait abnormality.  No recent fever or chills.  No nasal congestion or sinus pressure.  No previously similar symptoms. Past Medical History:  Diagnosis Date  . Allergic rhinitis   . Anemia    iron deficiency anemia   . Anxiety   . Asthma    allergy related   . Diverticulosis   . H/O hiatal hernia    repair with weight loss surgery   . Hypertension   . IBS (irritable bowel syndrome)   . PONV (postoperative nausea and vomiting)   . RLS (restless legs syndrome)     Patient Active Problem List   Diagnosis Date Noted  . Achilles tendon contracture, right 06/01/2017  . Dysuria 07/22/2015  . Microscopic hematuria 07/22/2015  . GERD (gastroesophageal reflux disease) 07/17/2015  . Anxiety 07/17/2015  . Depression 07/17/2015  . Hypercholesteremia 07/17/2015  . IBS (irritable bowel syndrome) 07/17/2015    Past Surgical History:  Procedure Laterality Date  . CHOLECYSTECTOMY N/A 01/12/2014   Procedure: LAPAROSCOPIC CHOLECYSTECTOMY;  Surgeon: Glenna Fellows, MD;  Location: [facility];  Service: General;  Laterality: N/A;  . gastric sleeve wtih hiatal hernia repair     . TONSILLECTOMY    . uterine ablaton        OB History   No obstetric history on file.        Home Medications    Prior to Admission medications   Medication Sig Start Date End Date Taking? Authorizing Provider  albuterol (VENTOLIN HFA) 108 (90 BASE) MCG/ACT inhaler Inhale 2 puffs into the lungs every 6 (six) hours as needed for wheezing or shortness of breath.    [provider]  amLODipine (NORVASC) 5 MG tablet Take 5 mg by mouth every morning.    [provider]  DONNATAL 16.2 MG tablet Take 1 tablet by mouth 2 (two) times daily. 07/14/15   [provider]  escitalopram (LEXAPRO) 10 MG tablet  07/30/15   [provider]  fexofenadine (ALLEGRA) 180 MG tablet Take 180 mg by mouth every morning. Reported on 08/10/2015    [provider]  GAVILYTE-N WITH FLAVOR PACK 420 g solution take by mouth as directed 08/05/15   [provider]  glycopyrrolate (ROBINUL) 2 MG tablet Take 1 tablet (2 mg total) by mouth 2 (two) times daily. 05/12/17   Meryl Dare, MD  meclizine (ANTIVERT) 25 MG tablet Take 1 tablet (25 mg total) by mouth 3 (three) times daily as needed for dizziness. 04/18/18   Loren Racer, MD  metoprolol succinate (TOPROL-XL) 25 MG 24 hr tablet Take 25 mg by mouth daily.    [provider]  montelukast (SINGULAIR) 10 MG tablet Take 10 mg by  mouth every morning.    [provider]  Multiple Vitamin (MULTIVITAMIN WITH MINERALS) TABS tablet Take 1 tablet by mouth every morning.    [provider]  Naproxen Sodium (ALEVE) 220 MG CAPS Take 1 capsule by mouth 2 (two) times daily as needed (pain.). Reported on 08/10/2015    [provider]  omeprazole (PRILOSEC) 20 MG capsule Take 20 mg by mouth every morning.    [provider]  ondansetron (ZOFRAN) 4 MG tablet Take 4 mg by mouth every 8 (eight) hours as needed for nausea or vomiting.    [provider]  polyethylene glycol powder (GLYCOLAX/MIRALAX) powder Take 17 g by mouth 2 (two) times daily. Until daily soft stools  OTC  01/14/14   Junious SilkMerrell, Hannah, PA-C  traMADol (ULTRAM) 50 MG tablet Take 1-2 tablets (50-100 mg total) by mouth every 6 (six) hours as needed. 03/03/18   Kathryne HitchBlackman, Christopher Y, MD    Family History Family History  Problem Relation Age of Onset  . Cancer Father        prostate  . Liver disease Mother   . Breast cancer Paternal Aunt   . Hepatitis C Paternal Grandmother   . Colon cancer Paternal Uncle     Social History Social History   Tobacco Use  . Smoking status: Never Smoker  . Smokeless tobacco: Never Used  Substance Use Topics  . Alcohol use: Yes    Comment: rare  . Drug use: No     Allergies   Cortisone; Other; and Percocet [oxycodone-acetaminophen]   Review of Systems Review of Systems  Constitutional: Negative for chills and fever.  HENT: Negative for congestion, sinus pressure, trouble swallowing and voice change.   Eyes: Negative for visual disturbance.  Respiratory: Negative for cough and shortness of breath.   Cardiovascular: Negative for chest pain.  Gastrointestinal: Positive for nausea. Negative for abdominal pain, constipation, diarrhea and vomiting.  Genitourinary: Negative for dysuria and flank pain.  Musculoskeletal: Negative for back pain, gait problem, myalgias and neck pain.  Skin: Negative for rash and wound.  Neurological: Positive for dizziness and headaches. Negative for syncope, speech difficulty, weakness, light-headedness and numbness.  All other systems reviewed and are negative.    Physical Exam Updated Vital Signs BP 139/82 (BP Location: Left Arm)   Pulse 66   Temp 97.8 F (36.6 C) (Oral)   Resp 18   Ht 5\' 4"  (1.626 m)   Wt 100.7 kg   SpO2 97%   BMI 38.11 kg/m   Physical Exam Vitals signs and nursing note reviewed.  Constitutional:      General: She is not in acute distress.    Appearance: Normal appearance. She is well-developed. She is not ill-appearing.  HENT:     Head: Normocephalic and atraumatic.     Comments:  Cranial nerves II through XII intact. Eyes:     Extraocular Movements: Extraocular movements intact.     Pupils: Pupils are equal, round, and reactive to light.     Comments: Patient has a few beats of fatigable horizontal nystagmus when looking to the right.  Neck:     Musculoskeletal: Normal range of motion and neck supple. No neck rigidity or muscular tenderness.     Comments: No meningismus Cardiovascular:     Rate and Rhythm: Normal rate and regular rhythm.     Heart sounds: No murmur. No friction rub. No gallop.   Pulmonary:     Effort: Pulmonary effort is normal. No respiratory distress.  Breath sounds: Normal breath sounds. No stridor. No wheezing, rhonchi or rales.  Abdominal:     General: Bowel sounds are normal.     Palpations: Abdomen is soft.     Tenderness: There is no abdominal tenderness. There is no guarding or rebound.  Musculoskeletal: Normal range of motion.        General: No swelling, tenderness, deformity or signs of injury.     Right lower leg: No edema.     Left lower leg: No edema.  Lymphadenopathy:     Cervical: No cervical adenopathy.  Skin:    General: Skin is warm and dry.     Capillary Refill: Capillary refill takes less than 2 seconds.     Findings: No erythema or rash.  Neurological:     General: No focal deficit present.     Mental Status: She is alert and oriented to person, place, and time.     Comments: Patient is alert and oriented x3 with clear, goal oriented speech. Patient has 5/5 motor in all extremities. Sensation is intact to light touch. Bilateral finger-to-nose is normal with no signs of dysmetria.   Psychiatric:        Mood and Affect: Mood normal.        Behavior: Behavior normal.      ED Treatments / Results  Labs (all labs ordered are listed, but only abnormal results are displayed) Labs Reviewed  COMPREHENSIVE METABOLIC PANEL - Abnormal; Notable for the following components:      Result Value   ALT 46 (*)    All other  components within normal limits  CBC WITH DIFFERENTIAL/PLATELET    EKG EKG Interpretation  Date/Time:  Sunday April 18 2018 16:08:48 EST Ventricular Rate:  71 PR Interval:    QRS Duration: 108 QT Interval:  401 QTC Calculation: 436 R Axis:   56 Text Interpretation:  Sinus rhythm Baseline wander in lead(s) V6 Confirmed by Loren Racer ([id number]) on 04/18/2018 4:38:27 PM Also confirmed by Loren Racer ([id number]), editor Lanae Boast [phone])  on 04/18/2018 4:50:07 PM   Radiology Mr Brain Wo Contrast  Result Date: 04/18/2018 CLINICAL DATA:  Acute onset of vertigo over the last 2 weeks. EXAM: MRI HEAD WITHOUT CONTRAST TECHNIQUE: Multiplanar, multiecho pulse sequences of the brain and surrounding structures were obtained without intravenous contrast. COMPARISON:  Head CT 04/23/2004 FINDINGS: Brain: The brain has a normal appearance without evidence of malformation, atrophy, old or acute small or large vessel infarction, mass lesion, hemorrhage, hydrocephalus or extra-axial collection. Vascular: Major vessels at the base of the brain show flow. Venous sinuses appear patent. Skull and upper cervical spine: Normal. Sinuses/Orbits: Clear/normal. Other: None significant. IMPRESSION: Normal examination. Electronically Signed   By: Paulina Fusi M.D.   On: 04/18/2018 18:43    Procedures Procedures (including critical care time)  Medications Ordered in ED Medications  meclizine (ANTIVERT) tablet 25 mg (25 mg Oral Given 04/18/18 1946)     Initial Impression / Assessment and Plan / ED Course  I have reviewed the triage vital signs and the nursing notes.  Pertinent labs & imaging results that were available during my care of the patient were reviewed by me and considered in my medical decision making (see chart for details).     MRI without acute findings.  Symptoms consistent with peripheral vertigo.  Ambulating without difficulty.  Will give prescription of meclizine and ENT follow-up as  needed.  Return precautions given  Final Clinical Impressions(s) / ED Diagnoses   Final  diagnoses:  Peripheral vertigo, unspecified laterality    ED Discharge Orders         Ordered    meclizine (ANTIVERT) 25 MG tablet  3 times daily PRN     04/18/18 1953           Loren Racer, MD 04/18/18 1954

## 2018-04-18 NOTE — ED Triage Notes (Signed)
Pt here for eval of "feeling like she is on a boat" at rest, with movement and when looking upwards. Pt has also had some nausea with a headache. Denies blurred vision. Symptoms started 4 days ago and got worse today. Denies recent falls/injuries, pt is not on blood thinners. Denies CP or shortness of breath. No weakness.

## 2018-09-06 ENCOUNTER — Other Ambulatory Visit: Payer: Self-pay

## 2018-09-06 ENCOUNTER — Encounter: Payer: Self-pay | Admitting: Orthopedic Surgery

## 2018-09-06 ENCOUNTER — Ambulatory Visit: Payer: Self-pay

## 2018-09-06 ENCOUNTER — Ambulatory Visit (INDEPENDENT_AMBULATORY_CARE_PROVIDER_SITE_OTHER): Payer: BC Managed Care – PPO | Admitting: Orthopedic Surgery

## 2018-09-06 DIAGNOSIS — M25462 Effusion, left knee: Secondary | ICD-10-CM | POA: Diagnosis not present

## 2018-09-06 DIAGNOSIS — S83242D Other tear of medial meniscus, current injury, left knee, subsequent encounter: Secondary | ICD-10-CM

## 2018-09-06 MED ORDER — BUPIVACAINE HCL 0.25 % IJ SOLN
4.0000 mL | INTRAMUSCULAR | Status: AC | PRN
  Administered 2018-09-06: 4 mL via INTRA_ARTICULAR

## 2018-09-06 MED ORDER — LIDOCAINE HCL 1 % IJ SOLN
5.0000 mL | INTRAMUSCULAR | Status: AC | PRN
  Administered 2018-09-06: 5 mL

## 2018-09-06 NOTE — Progress Notes (Signed)
Office Visit Note   Patient: Paige Alvarez           Date of Birth: 1965/09/02           MRN: [id number] Visit Date: 09/06/2018 Requested by: Blair HeysEhinger, Robert, MD [address] [place],  [place] [id number] PCP: Blair HeysEhinger, Robert, MD  Subjective: Chief Complaint  Patient presents with  . Left Knee - Pain    HPI: Paige PoundDeborah is a patient with left knee pain.  Date of injury 09/05/2018.  She injured her knee jumping off the back of a truck.  She felt a pop and she had nauseating type pain.  She was doing yard work.  Is been very painful.  She has been partial weightbearing with crutches.  Describes posterior pain.  No prior surgical history on the knee.              ROS: All systems reviewed are negative as they relate to the chief complaint within the history of present illness.  Patient denies  fevers or chills.   Assessment & Plan: Visit Diagnoses:  1. Other tear of medial meniscus, current injury, left knee, subsequent encounter   2. Effusion, left knee     Plan: Impression is bloody effusion left knee with difficult exam due to guarding.  Aspiration performed today.  MRI scan pending.  Okay for range of motion exercises but hold off on weightbearing until we know if this is meniscal tear and/or ligament injury and/or occult tibial plateau fracture.  I will see her back after that study.  Follow-Up Instructions: No follow-ups on file.   Orders:  Orders Placed This Encounter  Procedures  . XR Knee 1-2 Views Left  . MR Knee Left w/o contrast   No orders of the defined types were placed in this encounter.     Procedures: Large Joint Inj: L knee on 09/06/2018 11:31 AM Indications: diagnostic evaluation, joint swelling and pain Details: 18 G 1.5 in needle, superolateral approach  Arthrogram: No  Medications: 5 mL lidocaine 1 %; 4 mL bupivacaine 0.25 % Aspirate: 25 mL bloody Outcome: tolerated well, no immediate complications Procedure, treatment alternatives,  risks and benefits explained, specific risks discussed. Consent was given by the patient. Immediately prior to procedure a time out was called to verify the correct patient, procedure, equipment, support staff and site/side marked as required. Patient was prepped and draped in the usual sterile fashion.       Clinical Data: No additional findings.  Objective: Vital Signs: There were no vitals taken for this visit.  Physical Exam:   Constitutional: Patient appears well-developed HEENT:  Head: Normocephalic Eyes:EOM are normal Neck: Normal range of motion Cardiovascular: Normal rate Pulmonary/chest: Effort normal Neurologic: Patient is alert Skin: Skin is warm Psychiatric: Patient has normal mood and affect    Ortho Exam: Ortho exam demonstrates left knee that does achieve full extension but she has difficulty with flexion past 60 degrees.  Effusion is present in the knee.  Pedal pulses palpable.  Difficult to really get a great exam on the collateral and cruciate ligaments due to guarding but I do not think that any of them feel grossly unstable.  I think it is possible she may have ligament and meniscal damage or an occult tibial plateau fracture.  Extensor mechanism is intact.  I will see her back after the study.  Specialty Comments:  No specialty comments available.  Imaging: No results found.   PMFS History: Patient Active Problem  List   Diagnosis Date Noted  . Achilles tendon contracture, right 06/01/2017  . Dysuria 07/22/2015  . Microscopic hematuria 07/22/2015  . GERD (gastroesophageal reflux disease) 07/17/2015  . Anxiety 07/17/2015  . Depression 07/17/2015  . Hypercholesteremia 07/17/2015  . IBS (irritable bowel syndrome) 07/17/2015   Past Medical History:  Diagnosis Date  . Allergic rhinitis   . Anemia    iron deficiency anemia   . Anxiety   . Asthma    allergy related   . Diverticulosis   . H/O hiatal hernia    repair with weight loss surgery   .  Hypertension   . IBS (irritable bowel syndrome)   . PONV (postoperative nausea and vomiting)   . RLS (restless legs syndrome)     Family History  Problem Relation Age of Onset  . Cancer Father        prostate  . Liver disease Mother   . Breast cancer Paternal Aunt   . Hepatitis C Paternal Grandmother   . Colon cancer Paternal Uncle     Past Surgical History:  Procedure Laterality Date  . CHOLECYSTECTOMY N/A 01/12/2014   Procedure: LAPAROSCOPIC CHOLECYSTECTOMY;  Surgeon: Excell Seltzer, MD;  Location: [facility];  Service: General;  Laterality: N/A;  . gastric sleeve wtih hiatal hernia repair     . TONSILLECTOMY    . uterine ablaton      Social History   Occupational History  . Not on file  Tobacco Use  . Smoking status: Never Smoker  . Smokeless tobacco: Never Used  Substance and Sexual Activity  . Alcohol use: Yes    Comment: rare  . Drug use: No  . Sexual activity: Not on file

## 2018-09-09 ENCOUNTER — Other Ambulatory Visit: Payer: Self-pay

## 2018-09-09 ENCOUNTER — Ambulatory Visit
Admission: RE | Admit: 2018-09-09 | Discharge: 2018-09-09 | Disposition: A | Discharge status: Home / Self Care | Payer: BC Managed Care – PPO | Source: Ambulatory Visit | Attending: Orthopedic Surgery | Admitting: Orthopedic Surgery

## 2018-09-09 DIAGNOSIS — S83242D Other tear of medial meniscus, current injury, left knee, subsequent encounter: Secondary | ICD-10-CM

## 2018-09-10 ENCOUNTER — Encounter: Payer: Self-pay | Admitting: Orthopedic Surgery

## 2018-09-10 ENCOUNTER — Ambulatory Visit (INDEPENDENT_AMBULATORY_CARE_PROVIDER_SITE_OTHER): Payer: BC Managed Care – PPO | Admitting: Orthopedic Surgery

## 2018-09-10 DIAGNOSIS — S83242D Other tear of medial meniscus, current injury, left knee, subsequent encounter: Secondary | ICD-10-CM

## 2018-09-10 NOTE — Progress Notes (Signed)
Office Visit Note   Patient: Paige Alvarez           Date of Birth: 01/13/1966           MRN: [id number] Visit Date: 09/10/2018 Requested by: Gaynelle Arabian, MD [address] [place],  [place] [address] PCP: Gaynelle Arabian, MD  Subjective: Chief Complaint  Patient presents with  . Follow-up    HPI: Paige Alvarez is a patient with left knee injury.  She injured her knee about 6 days ago.  She has been weightbearing as tolerated with crutches.  She has a history of hypertension but no personal or family history of DVT or pulmonary embolism.  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  She does report some symptomatic instability with that left knee.              ROS: All systems reviewed are negative as they relate to the chief complaint within the history of present illness.  Patient denies  fevers or chills.   Assessment & Plan: Visit Diagnoses:  1. Other tear of medial meniscus, current injury, left knee, subsequent encounter     Plan: Impression is ACL tear and medial meniscal tear.  Plan is left knee ACL reconstruction with medial meniscal debridement and/or repair.  Risk and benefits are discussed with the patient including but limited to infection nerve vessel damage knee stiffness as well as prolonged rehab time before being able play tennis again.  The nonoperative options were also discussed but based on her desire to play tennis again as well as the meniscal tear and ACL tear combo I think her best bet would be reconstruction.  We discussed allograft versus autograft.  Again I would favor autograft for optimal soft tissue to bone healing potential.  Patient understands risk benefits and all questions are answered.  Follow-Up Instructions: No follow-ups on file.   Orders:  No orders of the defined types were placed in this encounter.  No orders of the defined types were placed in this  encounter.     Procedures: No procedures performed   Clinical Data: No additional findings.  Objective: Vital Signs: There were no vitals taken for this visit.  Physical Exam:   Constitutional: Patient appears well-developed HEENT:  Head: Normocephalic Eyes:EOM are normal Neck: Normal range of motion Cardiovascular: Normal rate Pulmonary/chest: Effort normal Neurologic: Patient is alert Skin: Skin is warm Psychiatric: Patient has normal mood and affect    Ortho Exam: Ortho exam demonstrates mild effusion left knee.  No patellar apprehension.  Collaterals are stable.  There is no posterior lateral rotatory instability.  ACL laxity is present.  Pedal pulses palpable.  She has full extension and flexion beyond 90.  No groin pain with internal X rotation of the leg.  Specialty Comments:  No specialty comments available.  Imaging: No results found.   PMFS History: Patient Active Problem List   Diagnosis Date Noted  . Achilles tendon contracture, right 06/01/2017  . Dysuria 07/22/2015  . Microscopic hematuria 07/22/2015  . GERD (gastroesophageal reflux disease) 07/17/2015  . Anxiety 07/17/2015  . Depression 07/17/2015  . Hypercholesteremia 07/17/2015  . IBS (irritable bowel syndrome) 07/17/2015   Past Medical History:  Diagnosis Date  . Allergic rhinitis   . Anemia    iron deficiency anemia   . Anxiety   . Asthma    allergy related   . Diverticulosis   .  H/O hiatal hernia    repair with weight loss surgery   . Hypertension   . IBS (irritable bowel syndrome)   . PONV (postoperative nausea and vomiting)   . RLS (restless legs syndrome)     Family History  Problem Relation Age of Onset  . Cancer Father        prostate  . Liver disease Mother   . Breast cancer Paternal Aunt   . Hepatitis C Paternal Grandmother   . Colon cancer Paternal Uncle     Past Surgical History:  Procedure Laterality Date  . CHOLECYSTECTOMY N/A 01/12/2014   Procedure:  LAPAROSCOPIC CHOLECYSTECTOMY;  Surgeon: Glenna FellowsBenjamin Hoxworth, MD;  Location: [facility];  Service: General;  Laterality: N/A;  . gastric sleeve wtih hiatal hernia repair     . TONSILLECTOMY    . uterine ablaton      Social History   Occupational History  . Not on file  Tobacco Use  . Smoking status: Never Smoker  . Smokeless tobacco: Never Used  Substance and Sexual Activity  . Alcohol use: Yes    Comment: rare  . Drug use: No  . Sexual activity: Not on file

## 2018-09-13 ENCOUNTER — Ambulatory Visit: Payer: BC Managed Care – PPO | Admitting: Orthopedic Surgery

## 2018-09-13 ENCOUNTER — Other Ambulatory Visit: Payer: Self-pay

## 2018-09-15 ENCOUNTER — Encounter (HOSPITAL_COMMUNITY): Payer: Self-pay | Admitting: *Deleted

## 2018-09-15 ENCOUNTER — Other Ambulatory Visit (HOSPITAL_COMMUNITY)
Admission: RE | Admit: 2018-09-15 | Discharge: 2018-09-15 | Disposition: A | Discharge status: Home / Self Care | Payer: BC Managed Care – PPO | Source: Ambulatory Visit | Attending: Orthopedic Surgery | Admitting: Orthopedic Surgery

## 2018-09-15 ENCOUNTER — Other Ambulatory Visit: Payer: Self-pay

## 2018-09-15 DIAGNOSIS — Z1159 Encounter for screening for other viral diseases: Secondary | ICD-10-CM | POA: Diagnosis not present

## 2018-09-15 LAB — SARS CORONAVIRUS 2 (TAT 6-24 HRS): SARS Coronavirus 2: NEGATIVE

## 2018-09-15 NOTE — Progress Notes (Signed)
Pt denies SOB, chest pain, and being under the care of a cardiologist. Pt denies having a stress test, echo and cardiac cath. Pt denies having a chest x ray within the last year. Pt denies recent labs. Pt made aware to stop taking Aspirin (unless otherwise advised by surgeon), Biotin, vitamins, fish oil and herbal medications. Do not take any NSAIDs ie: Ibuprofen, Advil, Naproxen (Aleve), Motrin, BC and Goody Powder. Pt denies that she and family members tested positive for COVID-19.   Pt denies that she and family experienced the following symptoms:  Cough yes/no: No Fever (>100.31F)  yes/no: No Runny nose yes/no: No Sore throat yes/no: No Difficulty breathing/shortness of breath  yes/no: No  Have you or a family member traveled in the last 14 days and where? yes/no: No  Pt reminded that hospital visitation restrictions are in effect and the importance of the restrictions.   Pt verbalized understanding of all pre-op instructions.

## 2018-09-16 ENCOUNTER — Ambulatory Visit (HOSPITAL_COMMUNITY)
Admission: RE | Admit: 2018-09-16 | Discharge: 2018-09-16 | Disposition: A | Discharge status: Home / Self Care | Payer: BC Managed Care – PPO | Attending: Orthopedic Surgery | Admitting: Orthopedic Surgery

## 2018-09-16 ENCOUNTER — Encounter (HOSPITAL_COMMUNITY)
Admission: RE | Disposition: A | Discharge status: Home / Self Care | Payer: Self-pay | Source: Home / Self Care | Attending: Orthopedic Surgery

## 2018-09-16 ENCOUNTER — Ambulatory Visit (HOSPITAL_COMMUNITY): Payer: BC Managed Care – PPO | Admitting: Anesthesiology

## 2018-09-16 ENCOUNTER — Encounter (HOSPITAL_COMMUNITY): Payer: Self-pay | Admitting: Surgery

## 2018-09-16 DIAGNOSIS — S83512D Sprain of anterior cruciate ligament of left knee, subsequent encounter: Secondary | ICD-10-CM

## 2018-09-16 DIAGNOSIS — S83242S Other tear of medial meniscus, current injury, left knee, sequela: Secondary | ICD-10-CM | POA: Diagnosis not present

## 2018-09-16 DIAGNOSIS — I10 Essential (primary) hypertension: Secondary | ICD-10-CM | POA: Insufficient documentation

## 2018-09-16 DIAGNOSIS — S83242A Other tear of medial meniscus, current injury, left knee, initial encounter: Secondary | ICD-10-CM | POA: Insufficient documentation

## 2018-09-16 DIAGNOSIS — Z79899 Other long term (current) drug therapy: Secondary | ICD-10-CM | POA: Insufficient documentation

## 2018-09-16 DIAGNOSIS — J45909 Unspecified asthma, uncomplicated: Secondary | ICD-10-CM | POA: Insufficient documentation

## 2018-09-16 DIAGNOSIS — X58XXXA Exposure to other specified factors, initial encounter: Secondary | ICD-10-CM | POA: Diagnosis not present

## 2018-09-16 DIAGNOSIS — F329 Major depressive disorder, single episode, unspecified: Secondary | ICD-10-CM | POA: Diagnosis not present

## 2018-09-16 DIAGNOSIS — K219 Gastro-esophageal reflux disease without esophagitis: Secondary | ICD-10-CM | POA: Diagnosis not present

## 2018-09-16 DIAGNOSIS — S83282S Other tear of lateral meniscus, current injury, left knee, sequela: Secondary | ICD-10-CM | POA: Diagnosis not present

## 2018-09-16 DIAGNOSIS — F419 Anxiety disorder, unspecified: Secondary | ICD-10-CM | POA: Insufficient documentation

## 2018-09-16 DIAGNOSIS — S83512A Sprain of anterior cruciate ligament of left knee, initial encounter: Secondary | ICD-10-CM | POA: Diagnosis not present

## 2018-09-16 DIAGNOSIS — S83282A Other tear of lateral meniscus, current injury, left knee, initial encounter: Secondary | ICD-10-CM | POA: Diagnosis not present

## 2018-09-16 DIAGNOSIS — M25562 Pain in left knee: Secondary | ICD-10-CM | POA: Diagnosis present

## 2018-09-16 HISTORY — DX: Family history of other specified conditions: Z84.89

## 2018-09-16 HISTORY — DX: Presence of spectacles and contact lenses: Z97.3

## 2018-09-16 HISTORY — DX: Unspecified tear of unspecified meniscus, current injury, left knee, initial encounter: S83.207A

## 2018-09-16 HISTORY — PX: ANTERIOR CRUCIATE LIGAMENT REPAIR: SHX115

## 2018-09-16 HISTORY — DX: Gastro-esophageal reflux disease without esophagitis: K21.9

## 2018-09-16 HISTORY — DX: Headache, unspecified: R51.9

## 2018-09-16 HISTORY — DX: Sprain of anterior cruciate ligament of left knee, initial encounter: S83.512A

## 2018-09-16 LAB — BASIC METABOLIC PANEL
Anion gap: 8 (ref 5–15)
BUN: 11 mg/dL (ref 6–20)
CO2: 26 mmol/L (ref 22–32)
Calcium: 9.3 mg/dL (ref 8.9–10.3)
Chloride: 107 mmol/L (ref 98–111)
Creatinine, Ser: 0.61 mg/dL (ref 0.44–1.00)
GFR calc Af Amer: >60 mL/min (ref >60)
GFR calc non Af Amer: >60 mL/min (ref >60)
Glucose, Bld: 100 mg/dL — ABNORMAL HIGH (ref 70–99)
Potassium: 3.7 mmol/L (ref 3.5–5.1)
Sodium: 141 mmol/L (ref 135–145)

## 2018-09-16 LAB — CBC
HCT: 41.6 % (ref 36.0–46.0)
Hemoglobin: 13.5 g/dL (ref 12.0–15.0)
MCH: 28.8 pg (ref 26.0–34.0)
MCHC: 32.5 g/dL (ref 30.0–36.0)
MCV: 88.9 fL (ref 80.0–100.0)
Platelets: 290 10*3/uL (ref 150–400)
RBC: 4.68 MIL/uL (ref 3.87–5.11)
RDW: 13.3 % (ref 11.5–15.5)
WBC: 6.7 10*3/uL (ref 4.0–10.5)
nRBC: 0 % (ref 0.0–0.2)

## 2018-09-16 LAB — POCT PREGNANCY, URINE: Preg Test, Ur: NEGATIVE

## 2018-09-16 SURGERY — RECONSTRUCTION, KNEE, ACL, USING HAMSTRING GRAFT
Anesthesia: Regional | Site: Knee | Laterality: Left

## 2018-09-16 MED ORDER — PROMETHAZINE HCL 25 MG/ML IJ SOLN
6.2500 mg | INTRAMUSCULAR | Status: AC | PRN
  Administered 2018-09-16: 12.5 mg via INTRAVENOUS
  Administered 2018-09-16: 15:00:00 6.25 mg via INTRAVENOUS

## 2018-09-16 MED ORDER — LIDOCAINE 2% (20 MG/ML) 5 ML SYRINGE
INTRAMUSCULAR | Status: AC
  Filled 2018-09-16: qty 5

## 2018-09-16 MED ORDER — EPINEPHRINE PF 1 MG/ML IJ SOLN
INTRAMUSCULAR | Status: AC
  Filled 2018-09-16: qty 4

## 2018-09-16 MED ORDER — PROPOFOL 10 MG/ML IV BOLUS
INTRAVENOUS | Status: DC | PRN
  Administered 2018-09-16: 200 mg via INTRAVENOUS

## 2018-09-16 MED ORDER — SODIUM CHLORIDE 0.9 % IR SOLN
Status: DC | PRN
  Administered 2018-09-16 (×2): 3000 mL
  Administered 2018-09-16: 6000 mL
  Administered 2018-09-16: 3000 mL

## 2018-09-16 MED ORDER — ROCURONIUM BROMIDE 10 MG/ML (PF) SYRINGE
PREFILLED_SYRINGE | INTRAVENOUS | Status: DC | PRN
  Administered 2018-09-16: 50 mg via INTRAVENOUS
  Administered 2018-09-16: 20 mg via INTRAVENOUS

## 2018-09-16 MED ORDER — EPHEDRINE SULFATE-NACL 50-0.9 MG/10ML-% IV SOSY
PREFILLED_SYRINGE | INTRAVENOUS | Status: DC | PRN
  Administered 2018-09-16 (×2): 5 mg via INTRAVENOUS

## 2018-09-16 MED ORDER — FENTANYL CITRATE (PF) 100 MCG/2ML IJ SOLN
100.0000 ug | Freq: Once | INTRAMUSCULAR | Status: AC
  Administered 2018-09-16: 100 ug via INTRAVENOUS

## 2018-09-16 MED ORDER — BUPIVACAINE HCL (PF) 0.25 % IJ SOLN
INTRAMUSCULAR | Status: AC
  Filled 2018-09-16: qty 30

## 2018-09-16 MED ORDER — LACTATED RINGERS IV SOLN
INTRAVENOUS | Status: DC
  Administered 2018-09-16 (×2): via INTRAVENOUS

## 2018-09-16 MED ORDER — KETAMINE HCL 10 MG/ML IJ SOLN
INTRAMUSCULAR | Status: DC | PRN
  Administered 2018-09-16: 20 mg via INTRAVENOUS
  Administered 2018-09-16: 10 mg via INTRAVENOUS

## 2018-09-16 MED ORDER — PROPOFOL 10 MG/ML IV BOLUS
INTRAVENOUS | Status: AC
  Filled 2018-09-16: qty 20

## 2018-09-16 MED ORDER — PROMETHAZINE HCL 25 MG/ML IJ SOLN
INTRAMUSCULAR | Status: AC
  Filled 2018-09-16: qty 1

## 2018-09-16 MED ORDER — SUFENTANIL CITRATE 50 MCG/ML IV SOLN
INTRAVENOUS | Status: AC
  Filled 2018-09-16: qty 1

## 2018-09-16 MED ORDER — MIDAZOLAM HCL 2 MG/2ML IJ SOLN
INTRAMUSCULAR | Status: AC
  Administered 2018-09-16: 2 mg via INTRAVENOUS
  Filled 2018-09-16: qty 2

## 2018-09-16 MED ORDER — OXYCODONE HCL 5 MG PO TABS: 5.0000 mg | ORAL_TABLET | ORAL | 0 refills | Status: DC | PRN

## 2018-09-16 MED ORDER — ROCURONIUM BROMIDE 10 MG/ML (PF) SYRINGE
PREFILLED_SYRINGE | INTRAVENOUS | Status: AC
  Filled 2018-09-16: qty 10

## 2018-09-16 MED ORDER — MORPHINE SULFATE (PF) 4 MG/ML IV SOLN
INTRAVENOUS | Status: AC
  Filled 2018-09-16: qty 2

## 2018-09-16 MED ORDER — SUFENTANIL CITRATE 50 MCG/ML IV SOLN
INTRAVENOUS | Status: DC | PRN
  Administered 2018-09-16: 15 ug via INTRAVENOUS

## 2018-09-16 MED ORDER — HYDROMORPHONE HCL 1 MG/ML IJ SOLN: 0.2500 mg | INTRAMUSCULAR | Status: DC | PRN

## 2018-09-16 MED ORDER — GLYCOPYRROLATE PF 0.2 MG/ML IJ SOSY
PREFILLED_SYRINGE | INTRAMUSCULAR | Status: DC | PRN
  Administered 2018-09-16: .1 mg via INTRAVENOUS

## 2018-09-16 MED ORDER — KETAMINE HCL 50 MG/5ML IJ SOSY
PREFILLED_SYRINGE | INTRAMUSCULAR | Status: AC
  Filled 2018-09-16: qty 5

## 2018-09-16 MED ORDER — MIDAZOLAM HCL 2 MG/2ML IJ SOLN
1.0000 mg | Freq: Once | INTRAMUSCULAR | Status: AC
  Administered 2018-09-16: 2 mg via INTRAVENOUS

## 2018-09-16 MED ORDER — ASPIRIN 81 MG PO CHEW: 81.0000 mg | CHEWABLE_TABLET | Freq: Every day | ORAL | 11 refills | Status: AC

## 2018-09-16 MED ORDER — METHOCARBAMOL 500 MG PO TABS: 500.0000 mg | ORAL_TABLET | Freq: Three times a day (TID) | ORAL | 0 refills | Status: DC | PRN

## 2018-09-16 MED ORDER — SODIUM CHLORIDE (PF) 0.9 % IJ SOLN
INTRAMUSCULAR | Status: AC
  Filled 2018-09-16: qty 10

## 2018-09-16 MED ORDER — SODIUM CHLORIDE 0.9 % IV SOLN
INTRAVENOUS | Status: DC | PRN
  Administered 2018-09-16: 13:00:00 60 ug/min via INTRAVENOUS

## 2018-09-16 MED ORDER — SUGAMMADEX SODIUM 200 MG/2ML IV SOLN
INTRAVENOUS | Status: DC | PRN
  Administered 2018-09-16: 200 mg via INTRAVENOUS

## 2018-09-16 MED ORDER — LIDOCAINE 2% (20 MG/ML) 5 ML SYRINGE
INTRAMUSCULAR | Status: DC | PRN
  Administered 2018-09-16: 60 mg via INTRAVENOUS
  Administered 2018-09-16: 40 mg via INTRAVENOUS

## 2018-09-16 MED ORDER — OXYCODONE HCL 5 MG PO TABS
5.0000 mg | ORAL_TABLET | Freq: Once | ORAL | Status: AC
  Administered 2018-09-16: 5 mg via ORAL

## 2018-09-16 MED ORDER — BUPIVACAINE-EPINEPHRINE (PF) 0.25% -1:200000 IJ SOLN
INTRAMUSCULAR | Status: AC
  Filled 2018-09-16: qty 30

## 2018-09-16 MED ORDER — SODIUM CHLORIDE 0.9 % IR SOLN
Status: DC | PRN
  Administered 2018-09-16: 4 mL

## 2018-09-16 MED ORDER — ONDANSETRON HCL 4 MG/2ML IJ SOLN
INTRAMUSCULAR | Status: DC | PRN
  Administered 2018-09-16: 4 mg via INTRAVENOUS

## 2018-09-16 MED ORDER — BUPIVACAINE HCL 0.25 % IJ SOLN
INTRAMUSCULAR | Status: DC | PRN
  Administered 2018-09-16: 10 mL

## 2018-09-16 MED ORDER — CHLORHEXIDINE GLUCONATE 4 % EX LIQD: 60.0000 mL | Freq: Once | CUTANEOUS | Status: DC

## 2018-09-16 MED ORDER — SCOPOLAMINE 1 MG/3DAYS TD PT72
1.0000 | MEDICATED_PATCH | TRANSDERMAL | Status: DC
  Administered 2018-09-16: 1.5 mg via TRANSDERMAL
  Filled 2018-09-16: qty 1

## 2018-09-16 MED ORDER — CEFAZOLIN SODIUM-DEXTROSE 2-4 GM/100ML-% IV SOLN
2.0000 g | INTRAVENOUS | Status: AC
  Administered 2018-09-16: 13:00:00 2 g via INTRAVENOUS

## 2018-09-16 MED ORDER — DEXAMETHASONE SODIUM PHOSPHATE 10 MG/ML IJ SOLN
INTRAMUSCULAR | Status: DC | PRN
  Administered 2018-09-16: 4 mg via INTRAVENOUS

## 2018-09-16 MED ORDER — CLONIDINE HCL (ANALGESIA) 100 MCG/ML EP SOLN
EPIDURAL | Status: DC | PRN
  Administered 2018-09-16: 1 mL

## 2018-09-16 MED ORDER — DEXAMETHASONE SODIUM PHOSPHATE 10 MG/ML IJ SOLN
INTRAMUSCULAR | Status: AC
  Filled 2018-09-16: qty 1

## 2018-09-16 MED ORDER — 0.9 % SODIUM CHLORIDE (POUR BTL) OPTIME
TOPICAL | Status: DC | PRN
  Administered 2018-09-16: 1000 mL

## 2018-09-16 MED ORDER — ACETAMINOPHEN 500 MG PO TABS
ORAL_TABLET | ORAL | Status: AC
  Administered 2018-09-16: 09:00:00 1000 mg via ORAL
  Filled 2018-09-16: qty 2

## 2018-09-16 MED ORDER — ONDANSETRON HCL 4 MG/2ML IJ SOLN
INTRAMUSCULAR | Status: AC
  Filled 2018-09-16: qty 2

## 2018-09-16 MED ORDER — ROPIVACAINE HCL 5 MG/ML IJ SOLN
INTRAMUSCULAR | Status: DC | PRN
  Administered 2018-09-16: 35 mL via PERINEURAL

## 2018-09-16 MED ORDER — MORPHINE SULFATE (PF) 4 MG/ML IV SOLN
INTRAVENOUS | Status: DC | PRN
  Administered 2018-09-16: 8 mg

## 2018-09-16 MED ORDER — EPHEDRINE 5 MG/ML INJ
INTRAVENOUS | Status: AC
  Filled 2018-09-16: qty 10

## 2018-09-16 MED ORDER — BUPIVACAINE-EPINEPHRINE 0.25% -1:200000 IJ SOLN
INTRAMUSCULAR | Status: DC | PRN
  Administered 2018-09-16: 10 mL

## 2018-09-16 MED ORDER — KETOROLAC TROMETHAMINE 30 MG/ML IJ SOLN: 30.0000 mg | Freq: Once | INTRAMUSCULAR | Status: DC | PRN

## 2018-09-16 MED ORDER — VANCOMYCIN HCL 1000 MG IV SOLR
INTRAVENOUS | Status: AC
  Filled 2018-09-16: qty 1000

## 2018-09-16 MED ORDER — FENTANYL CITRATE (PF) 100 MCG/2ML IJ SOLN
INTRAMUSCULAR | Status: AC
  Administered 2018-09-16: 100 ug via INTRAVENOUS
  Filled 2018-09-16: qty 2

## 2018-09-16 MED ORDER — OXYCODONE HCL 5 MG PO TABS
ORAL_TABLET | ORAL | Status: AC
  Filled 2018-09-16: qty 1

## 2018-09-16 MED ORDER — CEFAZOLIN SODIUM-DEXTROSE 2-4 GM/100ML-% IV SOLN
INTRAVENOUS | Status: AC
  Filled 2018-09-16: qty 100

## 2018-09-16 MED ORDER — ACETAMINOPHEN 500 MG PO TABS
1000.0000 mg | ORAL_TABLET | Freq: Once | ORAL | Status: AC
  Administered 2018-09-16: 1000 mg via ORAL

## 2018-09-16 SURGICAL SUPPLY — 91 items
ALCOHOL 70% 16 OZ (MISCELLANEOUS) ×3 IMPLANT
ANCHOR BUTTON TIGHTROPE ACL RT (Orthopedic Implant) ×3 IMPLANT
BANDAGE ACE 4X5 VEL STRL LF (GAUZE/BANDAGES/DRESSINGS) ×2 IMPLANT
BANDAGE ESMARK 6X9 LF (GAUZE/BANDAGES/DRESSINGS) IMPLANT
BLADE SURG 10 STRL SS (BLADE) ×3 IMPLANT
BLADE SURG 15 STRL LF DISP TIS (BLADE) ×2 IMPLANT
BLADE SURG 15 STRL SS (BLADE) ×6
BNDG CMPR 9X6 STRL LF SNTH (GAUZE/BANDAGES/DRESSINGS)
BNDG CMPR MED 10X6 ELC LF (GAUZE/BANDAGES/DRESSINGS) ×1
BNDG CMPR MED 15X6 ELC VLCR LF (GAUZE/BANDAGES/DRESSINGS) ×1
BNDG ELASTIC 6X10 VLCR STRL LF (GAUZE/BANDAGES/DRESSINGS) ×2 IMPLANT
BNDG ELASTIC 6X15 VLCR STRL LF (GAUZE/BANDAGES/DRESSINGS) ×3 IMPLANT
BNDG ESMARK 6X9 LF (GAUZE/BANDAGES/DRESSINGS)
BUR OVAL 6.0 (BURR) ×3 IMPLANT
BURR OVAL 8 FLU 4.0MM X 13CM (MISCELLANEOUS) ×1
BURR OVAL 8 FLU 4.0X13 (MISCELLANEOUS) ×1 IMPLANT
CLOSURE WOUND 1/2 X4 (GAUZE/BANDAGES/DRESSINGS) ×1
COVER MAYO STAND STRL (DRAPES) ×3 IMPLANT
COVER SURGICAL LIGHT HANDLE (MISCELLANEOUS) ×3 IMPLANT
COVER WAND RF STERILE (DRAPES) ×1 IMPLANT
CUFF TOURN SGL QUICK 34 (TOURNIQUET CUFF) ×3
CUFF TOURNIQUET SINGLE 34IN LL (TOURNIQUET CUFF) IMPLANT
CUFF TOURNIQUET SINGLE 44IN (TOURNIQUET CUFF) IMPLANT
CUFF TRNQT CYL 34X4.125X (TOURNIQUET CUFF) IMPLANT
DECANTER SPIKE VIAL GLASS SM (MISCELLANEOUS) ×3 IMPLANT
DISSECTOR 4.0MM X 13CM (MISCELLANEOUS) ×2 IMPLANT
DRAPE ARTHROSCOPY W/POUCH 114 (DRAPES) ×3 IMPLANT
DRAPE INCISE IOBAN 66X45 STRL (DRAPES) ×3 IMPLANT
DRAPE OEC MINIVIEW 54X84 (DRAPES) ×2 IMPLANT
DRAPE ORTHO SPLIT 77X108 STRL (DRAPES) ×3
DRAPE SURG ORHT 6 SPLT 77X108 (DRAPES) ×1 IMPLANT
DRAPE U-SHAPE 47X51 STRL (DRAPES) ×3 IMPLANT
DRILL FLIPCUTTER II 8.5MM (INSTRUMENTS) IMPLANT
DRSG TEGADERM 4X4.75 (GAUZE/BANDAGES/DRESSINGS) ×14 IMPLANT
DRSG XEROFORM 1X8 (GAUZE/BANDAGES/DRESSINGS) ×2 IMPLANT
DURAPREP 26ML APPLICATOR (WOUND CARE) ×6 IMPLANT
ELECT REM PT RETURN 9FT ADLT (ELECTROSURGICAL) ×3
ELECTRODE REM PT RTRN 9FT ADLT (ELECTROSURGICAL) ×1 IMPLANT
FLIPCUTTER II 8.5MM (INSTRUMENTS) ×3
GAUZE SPONGE 4X4 12PLY STRL LF (GAUZE/BANDAGES/DRESSINGS) ×5 IMPLANT
GLOVE BIOGEL PI IND STRL 8 (GLOVE) ×1 IMPLANT
GLOVE BIOGEL PI INDICATOR 8 (GLOVE) ×2
GLOVE ORTHO TXT STRL SZ7.5 (GLOVE) ×3 IMPLANT
GLOVE SURG ORTHO 8.0 STRL STRW (GLOVE) ×3 IMPLANT
GOWN STRL REUS W/ TWL LRG LVL3 (GOWN DISPOSABLE) ×3 IMPLANT
GOWN STRL REUS W/TWL LRG LVL3 (GOWN DISPOSABLE) ×9
IMP SYS 2ND FIX PEEK 4.75X19.1 (Miscellaneous) ×3 IMPLANT
IMPL SYS 2ND FX PEEK 4.75X19.1 (Miscellaneous) IMPLANT
KIT BASIN OR (CUSTOM PROCEDURE TRAY) ×3 IMPLANT
KIT BIOCARTILAGE DEL W/SYRINGE (KITS) IMPLANT
KIT BIOCARTILAGE LG JOINT MIX (KITS) ×5 IMPLANT
KIT TURNOVER KIT B (KITS) ×3 IMPLANT
MANIFOLD NEPTUNE II (INSTRUMENTS) ×3 IMPLANT
NDL 18GX1X1/2 (RX/OR ONLY) (NEEDLE) ×1 IMPLANT
NDL HYPO 18GX1.5 BLUNT FILL (NEEDLE) ×1 IMPLANT
NDL SAFETY ECLIPSE 18X1.5 (NEEDLE) IMPLANT
NEEDLE 18GX1X1/2 (RX/OR ONLY) (NEEDLE) ×9 IMPLANT
NEEDLE HYPO 18GX1.5 BLUNT FILL (NEEDLE) IMPLANT
NEEDLE HYPO 18GX1.5 SHARP (NEEDLE) ×3
NS IRRIG 1000ML POUR BTL (IV SOLUTION) ×3 IMPLANT
PACK ARTHROSCOPY DSU (CUSTOM PROCEDURE TRAY) ×3 IMPLANT
PAD ARMBOARD 7.5X6 YLW CONV (MISCELLANEOUS) ×6 IMPLANT
PAD CAST 4YDX4 CTTN HI CHSV (CAST SUPPLIES) IMPLANT
PADDING CAST COTTON 4X4 STRL (CAST SUPPLIES) ×3
PADDING CAST COTTON 6X4 STRL (CAST SUPPLIES) ×2 IMPLANT
PENCIL BUTTON HOLSTER BLD 10FT (ELECTRODE) ×3 IMPLANT
PK GRAFTLINK AUTO IMPLANT SYST (Anchor) ×3 IMPLANT
PUTTY DBM ALLOSYNC PURE 5CC (Putty) ×2 IMPLANT
SPONGE LAP 18X18 RF (DISPOSABLE) ×3 IMPLANT
SPONGE LAP 4X18 RFD (DISPOSABLE) ×2 IMPLANT
STRIP CLOSURE SKIN 1/2X4 (GAUZE/BANDAGES/DRESSINGS) ×2 IMPLANT
SUCTION FRAZIER HANDLE 10FR (MISCELLANEOUS) ×2
SUCTION TUBE FRAZIER 10FR DISP (MISCELLANEOUS) ×1 IMPLANT
SUT ETHILON 3 0 PS 1 (SUTURE) ×8 IMPLANT
SUT MNCRL AB 3-0 PS2 18 (SUTURE) ×3 IMPLANT
SUT VIC AB 0 CT1 27 (SUTURE) ×3
SUT VIC AB 0 CT1 27XBRD ANBCTR (SUTURE) ×1 IMPLANT
SUT VIC AB 2-0 CT1 27 (SUTURE) ×3
SUT VIC AB 2-0 CT1 TAPERPNT 27 (SUTURE) ×1 IMPLANT
SUT VICRYL 0 UR6 27IN ABS (SUTURE) ×3 IMPLANT
SYR 30ML LL (SYRINGE) ×3 IMPLANT
SYR 3ML LL SCALE MARK (SYRINGE) ×1 IMPLANT
SYR 5ML LL (SYRINGE) ×4 IMPLANT
SYR BULB IRRIGATION 50ML (SYRINGE) ×3 IMPLANT
SYR TB 1ML LUER SLIP (SYRINGE) ×3 IMPLANT
SYSTEM GRAFT IMPLANT AUTOGRAFT (Anchor) ×1 IMPLANT
TOWEL GREEN STERILE FF (TOWEL DISPOSABLE) ×1 IMPLANT
TOWEL OR 17X26 10 PK STRL BLUE (TOWEL DISPOSABLE) ×3 IMPLANT
TUBING ARTHROSCOPY IRRIG 16FT (MISCELLANEOUS) ×3 IMPLANT
WRAP KNEE MAXI GEL POST OP (GAUZE/BANDAGES/DRESSINGS) ×3 IMPLANT
YANKAUER SUCT BULB TIP NO VENT (SUCTIONS) ×3 IMPLANT

## 2018-09-16 NOTE — Anesthesia Procedure Notes (Signed)
Anesthesia Regional Block: Popliteal block   Pre-Anesthetic Checklist: ,, timeout performed, Correct Patient, Correct Site, Correct Laterality, Correct Procedure,, site marked, risks and benefits discussed, Surgical consent,  Pre-op evaluation,  At surgeon's request and post-op pain management  Laterality: Left  Prep: chloraprep       Needles:  Injection technique: Single-shot  Needle Type: Echogenic Stimulator Needle     Needle Length: 10cm  Needle Gauge: 21     Additional Needles:   Procedures:,,,, ultrasound used (permanent image in chart),,,,   Nerve Stimulator or Paresthesia:  Response: Quadriceps muscle contraction, 0.45 mA,   Additional Responses:   Narrative:  Start time: 09/16/2018 10:20 AM End time: 09/16/2018 10:30 AM Injection made incrementally with aspirations every 5 mL.  Performed by: Personally  Anesthesiologist: Murvin Natal, MD  Additional Notes: Functioning IV was confirmed and monitors were applied.  A 193mm 21ga P[place]unk echogenic stimulator needle was used. Sterile prep and drape,hand hygiene and sterile gloves were used.  Negative aspiration and negative test dose prior to incremental administration of local anesthetic. The patient tolerated the procedure well.

## 2018-09-16 NOTE — Brief Op Note (Signed)
   09/16/2018  3:21 PM  PATIENT:  Paige Alvarez  53 y.o. female  PRE-OPERATIVE DIAGNOSIS:  left knee anterior cruciate ligament tear, medial meniscal tear  POST-OPERATIVE DIAGNOSIS:  left knee anterior cruciate ligament tear, medial meniscal tear  PROCEDURE:  Procedure(s): LEFT KNEE RECONSTRUCTION ANTERIOR CRUCIATE LIGAMENT (ACL) WITH HAMSTRING AUTOGRAFT MENISCAL REPAIR VS DEBRIDEMENT  SURGEON:  Surgeon(s): Marlou Sa, Tonna Corner, MD  ASSISTANT: bethune and magnant  ANESTHESIA:   general  EBL: 10 ml    Total I/O In: 1100 [I.V.:1100] Out: 100 [Blood:100]  BLOOD ADMINISTERED: none  DRAINS: none   LOCAL MEDICATIONS USED: Marcaine morphine clonidine  SPECIMEN:  No Specimen  COUNTS:  YES  TOURNIQUET:  * Missing tourniquet times found for documented tourniquets in log: [id number] *  DICTATION: .Other Dictation: Dictation Number [id number]  PLAN OF CARE: Discharge to home after PACU  PATIENT DISPOSITION:  PACU - hemodynamically stable

## 2018-09-16 NOTE — Anesthesia Preprocedure Evaluation (Addendum)
Anesthesia Evaluation  Patient identified by MRN, date of birth, ID band Patient awake    Reviewed: Allergy & Precautions, NPO status , Patient's Chart, lab work & pertinent test results, reviewed documented beta blocker date and time   History of Anesthesia Complications (+) PONV and history of anesthetic complications  Airway Mallampati: III  TM Distance: >3 FB Neck ROM: Full    Dental no notable dental hx.    Pulmonary asthma ,    Pulmonary exam normal breath sounds clear to auscultation       Cardiovascular hypertension, Pt. on medications and Pt. on home beta blockers Normal cardiovascular exam Rhythm:Regular Rate:Normal  ECG: SR, rate 71   Neuro/Psych  Headaches, PSYCHIATRIC DISORDERS Anxiety Depression    GI/Hepatic Neg liver ROS, hiatal hernia, GERD  Medicated and Controlled,IBS (irritable bowel syndrome)   Endo/Other  negative endocrine ROS  Renal/GU negative Renal ROS     Musculoskeletal RLS (restless legs syndrome)   Abdominal (+) + obese,   Peds  Hematology negative hematology ROS (+)   Anesthesia Other Findings left knee anterior cruciate ligament tear medial meniscal tear  Reproductive/Obstetrics                            Anesthesia Physical Anesthesia Plan  ASA: III  Anesthesia Plan: General and Regional   Post-op Pain Management: GA combined w/ Regional for post-op pain   Induction: Intravenous  PONV Risk Score and Plan: 4 or greater and Ondansetron, Dexamethasone, Midazolam, Scopolamine patch - Pre-op and Treatment may vary due to age or medical condition  Airway Management Planned: Oral ETT and LMA  Additional Equipment:   Intra-op Plan:   Post-operative Plan: Extubation in OR  Informed Consent: I have reviewed the patients History and Physical, chart, labs and discussed the procedure including the risks, benefits and alternatives for the proposed anesthesia  with the patient or authorized representative who has indicated his/her understanding and acceptance.     Dental advisory given  Plan Discussed with: CRNA  Anesthesia Plan Comments:       Anesthesia Quick Evaluation

## 2018-09-16 NOTE — Anesthesia Procedure Notes (Signed)
Anesthesia Regional Block: Adductor canal block   Pre-Anesthetic Checklist: ,, timeout performed, Correct Patient, Correct Site, Correct Laterality, Correct Procedure,, site marked, risks and benefits discussed, Surgical consent,  Pre-op evaluation,  At surgeon's request and post-op pain management  Laterality: Left  Prep: chloraprep       Needles:  Injection technique: Single-shot  Needle Type: Echogenic Stimulator Needle     Needle Length: 10cm  Needle Gauge: 21     Additional Needles:   Procedures:,,,, ultrasound used (permanent image in chart),,,,  Narrative:  Start time: 09/16/2018 10:10 AM End time: 09/16/2018 10:20 AM Injection made incrementally with aspirations every 5 mL.  Performed by: Personally  Anesthesiologist: Murvin Natal, MD  Additional Notes: Functioning IV was confirmed and monitors were applied. A time-out was performed. Hand hygiene and sterile gloves were used. The thigh was placed in a frog-leg position and prepped in a sterile fashion. A 129mm 21ga Pajunk echogenic stimulator needle was placed using ultrasound guidance.  Negative aspiration and negative test dose prior to incremental administration of local anesthetic. The patient tolerated the procedure well.

## 2018-09-16 NOTE — H&P (Signed)
Paige ReeveDeborah A Alvarez is an 53 y.o. female.   Chief Complaint: Left knee pain and instability HPI: Paige PoundDeborah is a 53 year old patient with left knee pain.  She stepped off a truck about 2 weeks ago and had immediate onset of pain and swelling in the left knee.  MRI scan demonstrated medial meniscal tear as well as ACL tear.  She reports symptomatic instability in the left knee and presents now for operative management.  No personal or family history of DVT or pulmonary embolism.  Past Medical History:  Diagnosis Date  . Allergic rhinitis   . Anemia    iron deficiency anemia   . Anxiety   . Asthma    allergy related   . Diverticulosis   . Family history of adverse reaction to anesthesia    pt stated that her mother had PONV  . GERD (gastroesophageal reflux disease)   . H/O hiatal hernia    repair with weight loss surgery   . Headache   . Hypertension   . IBS (irritable bowel syndrome)   . PONV (postoperative nausea and vomiting)   . RLS (restless legs syndrome)   . Tears of meniscus and ACL of left knee   . Wears glasses   . Wears glasses     Past Surgical History:  Procedure Laterality Date  . achilles lenghtening    . CHOLECYSTECTOMY N/A 01/12/2014   Procedure: LAPAROSCOPIC CHOLECYSTECTOMY;  Surgeon: Glenna FellowsBenjamin Hoxworth, MD;  Location: [facility];  Service: General;  Laterality: N/A;  . COLONOSCOPY WITH ESOPHAGOGASTRODUODENOSCOPY (EGD)    . gastric sleeve wtih hiatal hernia repair     . TONSILLECTOMY    . uterine ablaton       Family History  Problem Relation Age of Onset  . Cancer Father        prostate  . Liver disease Mother   . Breast cancer Paternal Aunt   . Hepatitis C Paternal Grandmother   . Colon cancer Paternal Uncle    Social History:  reports that she has never smoked. She has never used smokeless tobacco. She reports current alcohol use. She reports that she does not use drugs.  Allergies:  Allergies  Allergen Reactions  . Adhesive [Tape] Itching    " surgical  glue causes redness and itching." Denies itching with adhesive tape  . Cortisone Hives  . Other Swelling and Other (See Comments)    "Anything with Mint". Ingested- Swelling. On skin- Starts to blister.   Marland Kitchen. Percocet [Oxycodone-Acetaminophen] Nausea Only    Medications Prior to Admission  Medication Sig Dispense Refill  . amLODipine (NORVASC) 5 MG tablet Take 5 mg by mouth every morning.    . Bempedoic Acid (NEXLETOL) 180 MG TABS Take 180 mg by mouth daily.    . Biotin 10 MG TABS Take 10 mg by mouth daily.    . Calcium-Vitamin D-Vitamin K (VIACTIV CALCIUM PLUS D) 650-12.5-40 MG-MCG-MCG CHEW Chew 1 tablet by mouth 2 (two) times a day.    . escitalopram (LEXAPRO) 10 MG tablet Take 10 mg by mouth daily.   0  . fexofenadine (ALLEGRA) 180 MG tablet Take 180 mg by mouth daily.     . metoprolol succinate (TOPROL-XL) 25 MG 24 hr tablet Take 25 mg by mouth daily.    . montelukast (SINGULAIR) 10 MG tablet Take 10 mg by mouth every morning.    . Naproxen Sodium (ALEVE) 220 MG CAPS Take 440 mg by mouth daily as needed (pain).     Marland Kitchen. omeprazole (  PRILOSEC) 20 MG capsule Take 20 mg by mouth every morning.    . ondansetron (ZOFRAN) 4 MG tablet Take 4 mg by mouth every 8 (eight) hours as needed for nausea or vomiting.    . polyethylene glycol powder (GLYCOLAX/MIRALAX) powder Take 17 g by mouth 2 (two) times daily. Until daily soft stools  OTC (Patient taking differently: Take 1 Container by mouth every other day. ) 119 g 0  . traZODone (DESYREL) 50 MG tablet Take 100 mg by mouth at bedtime.    . triamcinolone cream (KENALOG) 0.1 % Apply 1 application topically 2 (two) times daily as needed (eczema).    Marland Kitchen. GAVILYTE-N WITH FLAVOR PACK 420 g solution take by mouth as directed  0  . glycopyrrolate (ROBINUL) 2 MG tablet Take 1 tablet (2 mg total) by mouth 2 (two) times daily. (Patient not taking: Reported on 09/14/2018) 60 tablet 11  . meclizine (ANTIVERT) 25 MG tablet Take 1 tablet (25 mg total) by mouth 3  (three) times daily as needed for dizziness. (Patient not taking: Reported on 09/14/2018) 30 tablet 0  . traMADol (ULTRAM) 50 MG tablet Take 1-2 tablets (50-100 mg total) by mouth every 6 (six) hours as needed. (Patient not taking: Reported on 09/14/2018) 40 tablet 0    Results for orders placed or performed during the hospital encounter of 09/16/18 (from the past 48 hour(s))  Pregnancy, urine POC     Status: None   Collection Time: 09/16/18  8:40 AM  Result Value Ref Range   Preg Test, Ur NEGATIVE NEGATIVE    Comment:        THE SENSITIVITY OF THIS METHODOLOGY IS >24 mIU/mL   CBC     Status: None   Collection Time: 09/16/18  9:17 AM  Result Value Ref Range   WBC 6.7 4.0 - 10.5 K/uL   RBC 4.68 3.87 - 5.11 MIL/uL   Hemoglobin 13.5 12.0 - 15.0 g/dL   HCT 40.941.6 81.136.0 - 91.446.0 %   MCV 88.9 80.0 - 100.0 fL   MCH 28.8 26.0 - 34.0 pg   MCHC 32.5 30.0 - 36.0 g/dL   RDW 78.213.3 95.611.5 - 21.315.5 %   Platelets 290 150 - 400 K/uL   nRBC 0.0 0.0 - 0.2 %    Comment: Performed at [facility], [address] [place], [place] [address]  Basic metabolic panel     Status: Abnormal   Collection Time: 09/16/18  9:17 AM  Result Value Ref Range   Sodium 141 135 - 145 mmol/L   Potassium 3.7 3.5 - 5.1 mmol/L   Chloride 107 98 - 111 mmol/L   CO2 26 22 - 32 mmol/L   Glucose, Bld 100 (H) 70 - 99 mg/dL   BUN 11 6 - 20 mg/dL   Creatinine, Ser 8.460.61 0.44 - 1.00 mg/dL   Calcium 9.3 8.9 - 96.210.3 mg/dL   GFR calc non Af Amer >60 >60 mL/min   GFR calc Af Amer >60 >60 mL/min   Anion gap 8 5 - 15    Comment: Performed at [facility], [address] [place], [place] [address]   No results found.  Review of Systems  Musculoskeletal: Positive for joint pain.  All other systems reviewed and are negative.   Blood pressure (!) 101/59, pulse 61, temperature 98 F (36.7 C), temperature source Oral, resp. rate 16, height 5\' 4"  (1.626 m), weight 99.8 kg, SpO2 96 %. Physical Exam  Constitutional: She appears  well-developed.  HENT:  Head: Normocephalic.  Eyes: Pupils are equal, round, and reactive to light.  Neck: Normal range of motion.  Cardiovascular: Normal rate.  Respiratory: Effort normal.  Neurological: She is alert.  Skin: Skin is warm.  Psychiatric: She has a normal mood and affect.  Examination of the left knee demonstrates no posterior lateral corner laxity.  Collaterals are stable.  ACL is out PCL intact.  Pedal pulses palpable.  Range of motion is full extension to past 90 degrees of flexion.  Trace effusion is present.  Assessment/Plan Impression is left knee ACL tear with medial meniscal tear.  Plan is left knee ACL reconstruction using hamstring autograft.  Risk and benefits are discussed including but not limited to infection nerve vessel damage knee stiffness recurrent instability.  All questions answered.  We will also address the meniscal pathology with either repair and/or debridement.  Anderson Malta, MD 09/16/2018, 11:12 AM

## 2018-09-16 NOTE — Anesthesia Postprocedure Evaluation (Signed)
Anesthesia Post Note  Patient: Paige Alvarez  Procedure(s) Performed: LEFT KNEE RECONSTRUCTION ANTERIOR CRUCIATE LIGAMENT (ACL) WITH HAMSTRING AUTOGRAFT MENISCAL REPAIR VS DEBRIDEMENT (Left Knee)     Patient location during evaluation: PACU Anesthesia Type: Regional and General Level of consciousness: awake and alert Pain management: pain level controlled Vital Signs Assessment: post-procedure vital signs reviewed and stable Respiratory status: spontaneous breathing, nonlabored ventilation, respiratory function stable and patient connected to nasal cannula oxygen Cardiovascular status: blood pressure returned to baseline and stable Postop Assessment: no apparent nausea or vomiting Anesthetic complications: no    Last Vitals:  Vitals:   09/16/18 1537 09/16/18 1552  BP: 99/62 (!) 91/55  Pulse: (!) 58 60  Resp: 15 14  Temp:  36.8 C  SpO2: 95% 96%    Last Pain:  Vitals:   09/16/18 1552  TempSrc:   PainSc: 4                  Ryan P Ellender

## 2018-09-16 NOTE — Transfer of Care (Signed)
Immediate Anesthesia Transfer of Care Note  Patient: Paige Alvarez  Procedure(s) Performed: LEFT KNEE RECONSTRUCTION ANTERIOR CRUCIATE LIGAMENT (ACL) WITH HAMSTRING AUTOGRAFT MENISCAL REPAIR VS DEBRIDEMENT (Left Knee)  Patient Location: PACU  Anesthesia Type:General  Level of Consciousness: awake  Airway & Oxygen Therapy: Patient Spontanous Breathing  Post-op Assessment: Report given to RN and Post -op Vital signs reviewed and stable  Post vital signs: Reviewed and stable  Last Vitals:  Vitals Value Taken Time  BP    Temp    Pulse 69 09/16/18 1509  Resp 14 09/16/18 1509  SpO2 94 % 09/16/18 1509  Vitals shown include unvalidated device data.  Last Pain:  Vitals:   09/16/18 0854  TempSrc: Oral  PainSc:       Patients Stated Pain Goal: 3 (06/05/92 5859)  Complications: No apparent anesthesia complications

## 2018-09-16 NOTE — Op Note (Signed)
NAME: Sharia ReeveHARRIS, Amariyah A. MEDICAL RECORD [id number] ACCOUNT [id number] DATE OF BIRTH:03-Mar-1966 FACILITY: [facility] LOCATION: [facility] PHYSICIAN:GREGORY Diamantina ProvidenceS. DEAN, MD  OPERATIVE REPORT  DATE OF PROCEDURE:  09/16/2018  PREOPERATIVE DIAGNOSIS:  Left knee anterior cruciate ligament tear, medial and lateral meniscal tears.  POSTOPERATIVE DIAGNOSIS:  Left knee anterior cruciate ligament tear, medial and lateral meniscal tears.  PROCEDURE:  Left knee anterior cruciate ligament reconstruction using hamstring autograft and Arthrex Endobutton fixation and partial medial and lateral meniscectomy.  SURGEON:  Cammy CopaGregory Scott Dean, MD  ASSISTANT:  Patrick Jupiterarla Bethune, RNFA and Franky MachoLuke ____, [place].  INDICATIONS:  The patient is a 53 year old patient with left knee pain and instability who presents for operative management after explanation of risks and benefits.  PROCEDURE IN DETAIL:  The patient was brought to the operating room where general anesthetic was induced.  Preoperative antibiotics administered.  Timeout was called.  The left knee was examined under anesthesia and found to have ACL laxity, but good  stability to varus and valgus stress at 0 and 30 degrees with no posterolateral rotatory instability.  ACL was out.  At this time, the left leg was prescrubbed with alcohol and Betadine, allowed to air dry.  Prep with DuraPrep solution and draped in a  sterile manner.  Ioban used to cover the operative field.  Portals were anesthetized with 5 mL each of Marcaine with epinephrine.  At this time, incision made off of the tibial tubercle region just medial and distal to that.  Semitendinosis tendon was  harvested with care being taken to avoid injury to the saphenous nerve.  Tendon was prepared on the back table using quadruple technique to a size 70 graft x 8.5 mm.  Concurrent with this, the anterior inferolateral and anterior inferomedial portals were  established.  Diagnostic arthroscopy was performed.  The patient  had an unrepairable medial meniscal tear involving 50% anterior, posterior width of the meniscus.  This was resected back to a stable rim.  ACL was torn.  The lateral side was also  inspected and found to have a radial type tear involving about 20% anterior, posterior width of the meniscus.  This was also resected.  At this time, notchplasty was performed.  ACL stump debrided.  Patellofemoral compartment was generally intact and the  articular cartilage on the medial and lateral side also intact.  Following meniscal debridement and inspection and tunnel debridement, notchplasty was performed.  Using an Arthrex FlipCutter 8.5 mm the tunnel was placed in the 3 o'clock position.   Tibial tunnel was then drilled in the posterior aspect of the native ACL footprint.  Bone grafting was placed.  The graft was then passed into the femoral and tibial tunnels with fluoroscopic observation of excellent apposition of the Endobutton onto the  lateral distal femur.  Range of motion was cycled.  The button was then fixed in extension with a dual Endobutton technique backed up on the tibial side with a SwiveLock.  This gave excellent fixation.  At this time, thorough irrigation of the incision  as well as the knee was performed.  Portals were closed using 2-0 Vicryl, 3-0 nylon and the harvest site was closed using 0 Vicryl, 2-0 Vicryl and a Monocryl with Steri-Strips.  Bulky wrap and Ace wrap were applied along with a knee immobilizer.  The  patient tolerated the procedure well without immediate complications.  She was transferred to the recovery room in stable condition.  A solution of Marcaine, morphine, clonidine was injected into the knee prior to  getting rid of the drapes.  TN/NUANCE  D:09/16/2018 T:09/16/2018 JOB:[id number]

## 2018-09-16 NOTE — Anesthesia Procedure Notes (Signed)
Procedure Name: Intubation Performed by: Milford Cage, CRNA Pre-anesthesia Checklist: Patient identified, Emergency Drugs available, Suction available and Patient being monitored Patient Re-evaluated:Patient Re-evaluated prior to induction Oxygen Delivery Method: Circle System Utilized Preoxygenation: Pre-oxygenation with 100% oxygen Induction Type: IV induction Ventilation: Mask ventilation without difficulty Laryngoscope Size: Glidescope and 4 Grade View: Grade I Tube type: Oral Tube size: 7.0 mm Number of attempts: 2 Airway Equipment and Method: Stylet Placement Confirmation: ETT inserted through vocal cords under direct vision,  positive ETCO2 and breath sounds checked- equal and bilateral Secured at: 22 cm Tube secured with: Tape Dental Injury: Teeth and Oropharynx as per pre-operative assessment  Difficulty Due To: Difficulty was anticipated, Difficult Airway- due to limited oral opening and Difficult Airway- due to anterior larynx Comments: Patient had previous "elective" glidescope intubation d/t small mouth opening. 1st attempt: MAC 3 yielded a grade 3 view.  2nd attempt: Glidescope 4 yielded a grade 1 view and successful intubation. Patient has small OA and slightly anterior larynx

## 2018-09-17 ENCOUNTER — Telehealth: Payer: Self-pay | Admitting: Orthopedic Surgery

## 2018-09-17 ENCOUNTER — Encounter (HOSPITAL_COMMUNITY): Payer: Self-pay | Admitting: Orthopedic Surgery

## 2018-09-17 NOTE — Telephone Encounter (Signed)
IC s/w patient and advised ok to d/c ace wrap and cotton but to leave waterproof dressing on that was under the wrap. She verbalized understanding.

## 2018-09-17 NOTE — Telephone Encounter (Signed)
Pt called in said she had surgery with dr.dean yesterday and they told her it was okay to remove Ace bandage but she wanted to know if it was okay to remove the dressing under the bandage? She would like specific clarifications on how to properly  care for the wound and when to be able to shower.  [phone]

## 2018-09-21 ENCOUNTER — Encounter (HOSPITAL_COMMUNITY): Payer: Self-pay | Admitting: Orthopedic Surgery

## 2018-09-27 ENCOUNTER — Other Ambulatory Visit: Payer: Self-pay

## 2018-09-27 ENCOUNTER — Encounter: Payer: Self-pay | Admitting: Orthopedic Surgery

## 2018-09-27 ENCOUNTER — Ambulatory Visit (INDEPENDENT_AMBULATORY_CARE_PROVIDER_SITE_OTHER): Payer: BC Managed Care – PPO | Admitting: Orthopedic Surgery

## 2018-09-27 DIAGNOSIS — S83512D Sprain of anterior cruciate ligament of left knee, subsequent encounter: Secondary | ICD-10-CM

## 2018-09-27 MED ORDER — METHOCARBAMOL 500 MG PO TABS: 500.0000 mg | ORAL_TABLET | Freq: Three times a day (TID) | ORAL | 0 refills | Status: DC | PRN

## 2018-09-28 ENCOUNTER — Encounter: Payer: Self-pay | Admitting: Orthopedic Surgery

## 2018-09-28 NOTE — Progress Notes (Signed)
Post-Op Visit Note   Patient: Paige Alvarez           Date of Birth: [birth year]           MRN: [id number] Visit Date: 09/27/2018 PCP: Blair HeysEhinger, Robert, MD   Assessment & Plan:  Chief Complaint:  Chief Complaint  Patient presents with  . Left Knee - Routine Post Op   Visit Diagnoses: No diagnosis found.  Plan: Paige Alvarez is a patient with left knee ACL reconstruction.  She has been doing reasonably well.  On exam she flexes to about 90 and has nearly full extension.  Graft is stable.  Mild effusion is present but portal incisions are intact.  I like her to continue to work on knee range of motion exercises and strengthening.  Start outpatient physical therapy for range of motion primarily with strengthening a secondary concern.  No calf tenderness today.  She is walking partial weightbearing with crutches.  When she can do about 15 straight leg raises we can have her weight-bear as tolerated.  Is not really taking much in the way of pain medicine.  Follow-Up Instructions: Return in about 3 weeks (around 10/18/2018).   Orders:  No orders of the defined types were placed in this encounter.  Meds ordered this encounter  Medications  . methocarbamol (ROBAXIN) 500 MG tablet    Sig: Take 1 tablet (500 mg total) by mouth every 8 (eight) hours as needed for muscle spasms.    Dispense:  30 tablet    Refill:  0    Imaging: No results found.  PMFS History: Patient Active Problem List   Diagnosis Date Noted  . Achilles tendon contracture, right 06/01/2017  . Dysuria 07/22/2015  . Microscopic hematuria 07/22/2015  . GERD (gastroesophageal reflux disease) 07/17/2015  . Anxiety 07/17/2015  . Depression 07/17/2015  . Hypercholesteremia 07/17/2015  . IBS (irritable bowel syndrome) 07/17/2015   Past Medical History:  Diagnosis Date  . Allergic rhinitis   . Anemia    iron deficiency anemia   . Anxiety   . Asthma    allergy related   . Diverticulosis   . Family history of adverse  reaction to anesthesia    pt stated that her mother had PONV  . GERD (gastroesophageal reflux disease)   . H/O hiatal hernia    repair with weight loss surgery   . Headache   . Hypertension   . IBS (irritable bowel syndrome)   . PONV (postoperative nausea and vomiting)   . RLS (restless legs syndrome)   . Tears of meniscus and ACL of left knee   . Wears glasses   . Wears glasses     Family History  Problem Relation Age of Onset  . Cancer Father        prostate  . Liver disease Mother   . Breast cancer Paternal Aunt   . Hepatitis C Paternal Grandmother   . Colon cancer Paternal Uncle     Past Surgical History:  Procedure Laterality Date  . achilles lenghtening    . ANTERIOR CRUCIATE LIGAMENT REPAIR Left 09/16/2018   Procedure: LEFT KNEE RECONSTRUCTION ANTERIOR CRUCIATE LIGAMENT (ACL) WITH HAMSTRING AUTOGRAFT MENISCAL REPAIR VS DEBRIDEMENT;  Surgeon: Cammy Copaean, Kinslie Hove Scott, MD;  Location: [facility];  Service: Orthopedics;  Laterality: Left;  . CHOLECYSTECTOMY N/A 01/12/2014   Procedure: LAPAROSCOPIC CHOLECYSTECTOMY;  Surgeon: Glenna FellowsBenjamin Hoxworth, MD;  Location: [facility];  Service: General;  Laterality: N/A;  . COLONOSCOPY WITH ESOPHAGOGASTRODUODENOSCOPY (EGD)    . gastric  sleeve wtih hiatal hernia repair     . TONSILLECTOMY    . uterine ablaton      Social History   Occupational History  . Not on file  Tobacco Use  . Smoking status: Never Smoker  . Smokeless tobacco: Never Used  Substance and Sexual Activity  . Alcohol use: Yes    Comment: rare  . Drug use: No  . Sexual activity: Not on file

## 2018-10-20 ENCOUNTER — Ambulatory Visit (INDEPENDENT_AMBULATORY_CARE_PROVIDER_SITE_OTHER): Payer: BC Managed Care – PPO | Admitting: Orthopedic Surgery

## 2018-10-20 ENCOUNTER — Encounter: Payer: Self-pay | Admitting: Orthopedic Surgery

## 2018-10-20 ENCOUNTER — Other Ambulatory Visit: Payer: Self-pay

## 2018-10-20 DIAGNOSIS — S83512D Sprain of anterior cruciate ligament of left knee, subsequent encounter: Secondary | ICD-10-CM

## 2018-10-20 NOTE — Progress Notes (Signed)
Post-Op Visit Note   Patient: Paige ReeveDeborah A Zobel           Date of Birth: 27-Dec-1965           MRN: [id number] Visit Date: 10/20/2018 PCP: Blair HeysEhinger, Robert, MD   Assessment & Plan:  Chief Complaint:  Chief Complaint  Patient presents with  . Post-op Follow-up   Visit Diagnoses: No diagnosis found.  Plan: Patient is a 53 year old female who presents status post left ACL reconstruction with hamstring autograft on 09/16/2018.  Patient states that she is doing very well.  She has not been doing physical therapy but she is able to do 15 straight leg raises in the office today.  She is full weightbearing for the past 2 weeks.  She has not had any issues with stairs.  She has no effusion on exam today.  Her graft seems intact with a solid endpoint.  Her only complaint is some popping sensation in the knee with stairs, which I assured her is normal.  She will follow-up in 6 weeks.  Follow-Up Instructions: No follow-ups on file.   Orders:  No orders of the defined types were placed in this encounter.  No orders of the defined types were placed in this encounter.   Imaging: No results found.  PMFS History: Patient Active Problem List   Diagnosis Date Noted  . Achilles tendon contracture, right 06/01/2017  . Dysuria 07/22/2015  . Microscopic hematuria 07/22/2015  . GERD (gastroesophageal reflux disease) 07/17/2015  . Anxiety 07/17/2015  . Depression 07/17/2015  . Hypercholesteremia 07/17/2015  . IBS (irritable bowel syndrome) 07/17/2015   Past Medical History:  Diagnosis Date  . Allergic rhinitis   . Anemia    iron deficiency anemia   . Anxiety   . Asthma    allergy related   . Diverticulosis   . Family history of adverse reaction to anesthesia    pt stated that her mother had PONV  . GERD (gastroesophageal reflux disease)   . H/O hiatal hernia    repair with weight loss surgery   . Headache   . Hypertension   . IBS (irritable bowel syndrome)   . PONV (postoperative  nausea and vomiting)   . RLS (restless legs syndrome)   . Tears of meniscus and ACL of left knee   . Wears glasses   . Wears glasses     Family History  Problem Relation Age of Onset  . Cancer Father        prostate  . Liver disease Mother   . Breast cancer Paternal Aunt   . Hepatitis C Paternal Grandmother   . Colon cancer Paternal Uncle     Past Surgical History:  Procedure Laterality Date  . achilles lenghtening    . ANTERIOR CRUCIATE LIGAMENT REPAIR Left 09/16/2018   Procedure: LEFT KNEE RECONSTRUCTION ANTERIOR CRUCIATE LIGAMENT (ACL) WITH HAMSTRING AUTOGRAFT MENISCAL REPAIR VS DEBRIDEMENT;  Surgeon: Cammy Copaean, Gregory Scott, MD;  Location: [facility];  Service: Orthopedics;  Laterality: Left;  . CHOLECYSTECTOMY N/A 01/12/2014   Procedure: LAPAROSCOPIC CHOLECYSTECTOMY;  Surgeon: Glenna FellowsBenjamin Hoxworth, MD;  Location: [facility];  Service: General;  Laterality: N/A;  . COLONOSCOPY WITH ESOPHAGOGASTRODUODENOSCOPY (EGD)    . gastric sleeve wtih hiatal hernia repair     . TONSILLECTOMY    . uterine ablaton      Social History   Occupational History  . Not on file  Tobacco Use  . Smoking status: Never Smoker  . Smokeless tobacco: Never Used  Substance  and Sexual Activity  . Alcohol use: Yes    Comment: rare  . Drug use: No  . Sexual activity: Not on file

## 2018-12-01 ENCOUNTER — Encounter: Payer: Self-pay | Admitting: Orthopedic Surgery

## 2018-12-01 ENCOUNTER — Other Ambulatory Visit: Payer: Self-pay

## 2018-12-01 ENCOUNTER — Ambulatory Visit (INDEPENDENT_AMBULATORY_CARE_PROVIDER_SITE_OTHER): Payer: BC Managed Care – PPO | Admitting: Orthopedic Surgery

## 2018-12-01 DIAGNOSIS — S83512D Sprain of anterior cruciate ligament of left knee, subsequent encounter: Secondary | ICD-10-CM

## 2018-12-01 NOTE — Progress Notes (Signed)
Post-Op Visit Note   Patient: Paige ReeveDeborah A Cottrell           Date of Birth: September 13, 1965           MRN: [id number] Visit Date: 12/01/2018 PCP: Blair HeysEhinger, Robert, MD   Assessment & Plan:  Chief Complaint:  Chief Complaint  Patient presents with  . Left Knee - Follow-up   Visit Diagnoses:  1. Rupture of anterior cruciate ligament of left knee, subsequent encounter     Plan: RN Gavin PoundDeborah is now about 8 weeks out left knee ACL reconstruction hamstring autograft.  She reports little bit of popping but she is been working on her feet 8 to 10 hours a day.  On exam she has full extension and flexion pretty easily past 90 graft is stable trace effusion is present no calf tenderness is present.  I will have her continue to work on home exercise program on her own.  Never went to physical therapy.  She has excellent quad and hamstring strength at this time follow-up in 8 weeks for final check she is not planning on doing any cutting or pivoting exercises between now and then.  Follow-Up Instructions: Return in about 8 weeks (around 01/26/2019).   Orders:  No orders of the defined types were placed in this encounter.  No orders of the defined types were placed in this encounter.   Imaging: No results found.  PMFS History: Patient Active Problem List   Diagnosis Date Noted  . Achilles tendon contracture, right 06/01/2017  . Dysuria 07/22/2015  . Microscopic hematuria 07/22/2015  . GERD (gastroesophageal reflux disease) 07/17/2015  . Anxiety 07/17/2015  . Depression 07/17/2015  . Hypercholesteremia 07/17/2015  . IBS (irritable bowel syndrome) 07/17/2015   Past Medical History:  Diagnosis Date  . Allergic rhinitis   . Anemia    iron deficiency anemia   . Anxiety   . Asthma    allergy related   . Diverticulosis   . Family history of adverse reaction to anesthesia    pt stated that her mother had PONV  . GERD (gastroesophageal reflux disease)   . H/O hiatal hernia    repair with  weight loss surgery   . Headache   . Hypertension   . IBS (irritable bowel syndrome)   . PONV (postoperative nausea and vomiting)   . RLS (restless legs syndrome)   . Tears of meniscus and ACL of left knee   . Wears glasses   . Wears glasses     Family History  Problem Relation Age of Onset  . Cancer Father        prostate  . Liver disease Mother   . Breast cancer Paternal Aunt   . Hepatitis C Paternal Grandmother   . Colon cancer Paternal Uncle     Past Surgical History:  Procedure Laterality Date  . achilles lenghtening    . ANTERIOR CRUCIATE LIGAMENT REPAIR Left 09/16/2018   Procedure: LEFT KNEE RECONSTRUCTION ANTERIOR CRUCIATE LIGAMENT (ACL) WITH HAMSTRING AUTOGRAFT MENISCAL REPAIR VS DEBRIDEMENT;  Surgeon: Cammy Copaean, Christain Niznik Scott, MD;  Location: [facility];  Service: Orthopedics;  Laterality: Left;  . CHOLECYSTECTOMY N/A 01/12/2014   Procedure: LAPAROSCOPIC CHOLECYSTECTOMY;  Surgeon: Glenna FellowsBenjamin Hoxworth, MD;  Location: [facility];  Service: General;  Laterality: N/A;  . COLONOSCOPY WITH ESOPHAGOGASTRODUODENOSCOPY (EGD)    . gastric sleeve wtih hiatal hernia repair     . TONSILLECTOMY    . uterine ablaton      Social History   Occupational History  .  Not on file  Tobacco Use  . Smoking status: Never Smoker  . Smokeless tobacco: Never Used  Substance and Sexual Activity  . Alcohol use: Yes    Comment: rare  . Drug use: No  . Sexual activity: Not on file

## 2018-12-29 ENCOUNTER — Other Ambulatory Visit: Payer: Self-pay | Admitting: Physician Assistant

## 2018-12-29 DIAGNOSIS — Z1231 Encounter for screening mammogram for malignant neoplasm of breast: Secondary | ICD-10-CM

## 2019-01-03 ENCOUNTER — Other Ambulatory Visit: Payer: Self-pay

## 2019-01-03 DIAGNOSIS — Z20822 Contact with and (suspected) exposure to covid-19: Secondary | ICD-10-CM

## 2019-01-04 LAB — NOVEL CORONAVIRUS, NAA: SARS-CoV-2, NAA: NOT DETECTED

## 2019-01-26 ENCOUNTER — Other Ambulatory Visit: Payer: Self-pay

## 2019-01-26 ENCOUNTER — Ambulatory Visit: Payer: BC Managed Care – PPO | Admitting: Orthopedic Surgery

## 2019-01-26 DIAGNOSIS — S83512D Sprain of anterior cruciate ligament of left knee, subsequent encounter: Secondary | ICD-10-CM

## 2019-01-28 ENCOUNTER — Encounter: Payer: Self-pay | Admitting: Orthopedic Surgery

## 2019-01-28 NOTE — Progress Notes (Signed)
Office Visit Note   Patient: Paige Alvarez           Date of Birth: 1965/08/13           MRN: [id number] Visit Date: 01/26/2019 Requested by: Blair Heys, MD [address] [place],  [place] [address] PCP: Blair Heys, MD  Subjective: Chief Complaint  Patient presents with  . Follow-up    HPI: Paige Alvarez is a 53 y.o. female who presents to the office s/p left knee ACL reconstruction on 09/16/2018.  Patient states that she is doing well overall.  She has not been doing any physical therapy or taking any medication.  She notes some occasional pain in the medial left knee around the Pes anserine bursa and states that this is worse with stairs.                ROS:  All systems reviewed are negative as they relate to the chief complaint within the history of present illness.  Patient denies fevers or chills.  Assessment & Plan: Visit Diagnoses:  1. Rupture of anterior cruciate ligament of left knee, subsequent encounter     Plan: Patient is a 53 year old female who presents s/p left knee ACL reconstruction on 09/16/2018.  Overall she is doing well.  Her graft is stable on exam.  She has excellent strength of the quadriceps and hamstrings on exam.  Her main complaint today is tenderness over the hamstring tendons that insert on the pes anserine bursa.  Differential diagnosis includes hamstring tendinitis versus irritation from the Endobutton used for ACL fixation.  Recommended that patient try Voltaren gel topically.  If no improvement, will consider a cortisone injection to this area.  Patient will follow up in 4 weeks if she has no pain relief from the Voltaren.  Graft is stable.  She may be having a little bit of tenderness around the Endobutton site on the tibia.  Would not want to consider removing that until about a year postop.  I think topical is indicated first and cortisone injection next if symptoms persist.  Follow-Up Instructions: No follow-ups on file.    Orders:  No orders of the defined types were placed in this encounter.  No orders of the defined types were placed in this encounter.     Procedures: No procedures performed   Clinical Data: No additional findings.  Objective: Vital Signs: There were no vitals taken for this visit.  Physical Exam:  Constitutional: Patient appears well-developed HEENT:  Head: Normocephalic Eyes:EOM are normal Neck: Normal range of motion Cardiovascular: Normal rate Pulmonary/chest: Effort normal Neurologic: Patient is alert Skin: Skin is warm Psychiatric: Patient has normal mood and affect  Ortho Exam:  Left knee Exam No effusion Extensor mechanism intact No TTP over the medial or lateral jointlines, quad tendon, patellar tendon,  patella, tibial tubercle, LCL/MCL insertions TTP over the pes anserine bursa Stable to varus/valgus stresses.  Stable to anterior/posterior drawer. Graft stable with Lachman exam Extension to 0 degrees Flexion > 90 degrees Incisions healing well  Specialty Comments:  No specialty comments available.  Imaging: No results found.   PMFS History: Patient Active Problem List   Diagnosis Date Noted  . Achilles tendon contracture, right 06/01/2017  . Dysuria 07/22/2015  . Microscopic hematuria 07/22/2015  . GERD (gastroesophageal reflux disease) 07/17/2015  . Anxiety 07/17/2015  . Depression 07/17/2015  . Hypercholesteremia 07/17/2015  . IBS (irritable bowel syndrome) 07/17/2015   Past Medical History:  Diagnosis Date  .  Allergic rhinitis   . Anemia    iron deficiency anemia   . Anxiety   . Asthma    allergy related   . Diverticulosis   . Family history of adverse reaction to anesthesia    pt stated that her mother had PONV  . GERD (gastroesophageal reflux disease)   . H/O hiatal hernia    repair with weight loss surgery   . Headache   . Hypertension   . IBS (irritable bowel syndrome)   . PONV (postoperative nausea and vomiting)   .  RLS (restless legs syndrome)   . Tears of meniscus and ACL of left knee   . Wears glasses   . Wears glasses     Family History  Problem Relation Age of Onset  . Cancer Father        prostate  . Liver disease Mother   . Breast cancer Paternal Aunt   . Hepatitis C Paternal Grandmother   . Colon cancer Paternal Uncle     Past Surgical History:  Procedure Laterality Date  . achilles lenghtening    . ANTERIOR CRUCIATE LIGAMENT REPAIR Left 09/16/2018   Procedure: LEFT KNEE RECONSTRUCTION ANTERIOR CRUCIATE LIGAMENT (ACL) WITH HAMSTRING AUTOGRAFT MENISCAL REPAIR VS DEBRIDEMENT;  Surgeon: Meredith Pel, MD;  Location: [facility];  Service: Orthopedics;  Laterality: Left;  . CHOLECYSTECTOMY N/A 01/12/2014   Procedure: LAPAROSCOPIC CHOLECYSTECTOMY;  Surgeon: Excell Seltzer, MD;  Location: [facility];  Service: General;  Laterality: N/A;  . COLONOSCOPY WITH ESOPHAGOGASTRODUODENOSCOPY (EGD)    . gastric sleeve wtih hiatal hernia repair     . TONSILLECTOMY    . uterine ablaton      Social History   Occupational History  . Not on file  Tobacco Use  . Smoking status: Never Smoker  . Smokeless tobacco: Never Used  Substance and Sexual Activity  . Alcohol use: Yes    Comment: rare  . Drug use: No  . Sexual activity: Not on file

## 2019-02-09 ENCOUNTER — Other Ambulatory Visit: Payer: Self-pay

## 2019-02-09 ENCOUNTER — Ambulatory Visit
Admission: RE | Admit: 2019-02-09 | Discharge: 2019-02-09 | Disposition: A | Discharge status: Home / Self Care | Payer: BC Managed Care – PPO | Source: Ambulatory Visit | Attending: Physician Assistant | Admitting: Physician Assistant

## 2019-02-09 DIAGNOSIS — Z1231 Encounter for screening mammogram for malignant neoplasm of breast: Secondary | ICD-10-CM

## 2019-05-25 ENCOUNTER — Other Ambulatory Visit: Payer: Self-pay

## 2019-05-25 ENCOUNTER — Ambulatory Visit: Payer: Self-pay

## 2019-05-25 ENCOUNTER — Ambulatory Visit: Payer: BC Managed Care – PPO | Admitting: Orthopedic Surgery

## 2019-05-25 DIAGNOSIS — M79642 Pain in left hand: Secondary | ICD-10-CM

## 2019-05-26 ENCOUNTER — Encounter: Payer: Self-pay | Admitting: Orthopedic Surgery

## 2019-05-26 NOTE — Progress Notes (Signed)
Office Visit Note   Patient: Paige Alvarez           Date of Birth: Nov 23, 1965           MRN: [id number] Visit Date: 05/25/2019 Requested by: Gaynelle Arabian, MD [address] [place],  [place] [address] PCP: Gaynelle Arabian, MD  Subjective: Chief Complaint  Patient presents with  . Left Hand - Pain    HPI: Kaisley is a patient with left thumb cyst.'s been going on for several months but has gotten slightly larger.  At times it is painful but most of the time its not.  She is right-hand dominant.  Denies any numbness and tingling in the thumb.  Denies any triggering in the thumb.              ROS: All systems reviewed are negative as they relate to the chief complaint within the history of present illness.  Patient denies  fevers or chills.   Assessment & Plan: Visit Diagnoses:  1. Left hand pain     Plan: Impression is multilobulated left thumb cyst which is just on the ulnar aspect of the proximal thumb flexion crease.  By ultrasound examination it is multiloculated.  No nerve involvement at this time.  It does appear to be emanating from the A1 pulley.  Plan at this time is observation.  Because of the multiloculated nature of the cyst I do not think aspiration or injection would be efficacious or indicated.  If it bothers her significantly I think excision would be the best thing along with a 1 pulley release.  We will see her back as needed.  Follow-Up Instructions: No follow-ups on file.   Orders:  Orders Placed This Encounter  Procedures  . XR Finger Thumb Left   No orders of the defined types were placed in this encounter.     Procedures: No procedures performed   Clinical Data: No additional findings.  Objective: Vital Signs: There were no vitals taken for this visit.  Physical Exam:   Constitutional: Patient appears well-developed HEENT:  Head: Normocephalic Eyes:EOM are normal Neck: Normal range of motion Cardiovascular: Normal  rate Pulmonary/chest: Effort normal Neurologic: Patient is alert Skin: Skin is warm Psychiatric: Patient has normal mood and affect    Ortho Exam: Ortho exam demonstrates full range of motion of the IP and MCP joint of the thumb.  Negative grind test.  EPL FPL function intact.  No paresthesias on the radial or ulnar aspect of that left thumb.  No other masses lymphadenopathy or skin changes noted in that thumb region.  Specialty Comments:  No specialty comments available.  Imaging: No results found.   PMFS History: Patient Active Problem List   Diagnosis Date Noted  . Achilles tendon contracture, right 06/01/2017  . Dysuria 07/22/2015  . Microscopic hematuria 07/22/2015  . GERD (gastroesophageal reflux disease) 07/17/2015  . Anxiety 07/17/2015  . Depression 07/17/2015  . Hypercholesteremia 07/17/2015  . IBS (irritable bowel syndrome) 07/17/2015   Past Medical History:  Diagnosis Date  . Allergic rhinitis   . Anemia    iron deficiency anemia   . Anxiety   . Asthma    allergy related   . Diverticulosis   . Family history of adverse reaction to anesthesia    pt stated that her mother had PONV  . GERD (gastroesophageal reflux disease)   . H/O hiatal hernia    repair with weight loss surgery   . Headache   .  Hypertension   . IBS (irritable bowel syndrome)   . PONV (postoperative nausea and vomiting)   . RLS (restless legs syndrome)   . Tears of meniscus and ACL of left knee   . Wears glasses   . Wears glasses     Family History  Problem Relation Age of Onset  . Cancer Father        prostate  . Liver disease Mother   . Breast cancer Paternal Aunt   . Hepatitis C Paternal Grandmother   . Colon cancer Paternal Uncle     Past Surgical History:  Procedure Laterality Date  . achilles lenghtening    . ANTERIOR CRUCIATE LIGAMENT REPAIR Left 09/16/2018   Procedure: LEFT KNEE RECONSTRUCTION ANTERIOR CRUCIATE LIGAMENT (ACL) WITH HAMSTRING AUTOGRAFT MENISCAL REPAIR VS  DEBRIDEMENT;  Surgeon: Cammy Copa, MD;  Location: [facility];  Service: Orthopedics;  Laterality: Left;  . CHOLECYSTECTOMY N/A 01/12/2014   Procedure: LAPAROSCOPIC CHOLECYSTECTOMY;  Surgeon: Glenna Fellows, MD;  Location: [facility];  Service: General;  Laterality: N/A;  . COLONOSCOPY WITH ESOPHAGOGASTRODUODENOSCOPY (EGD)    . gastric sleeve wtih hiatal hernia repair     . TONSILLECTOMY    . uterine ablaton      Social History   Occupational History  . Not on file  Tobacco Use  . Smoking status: Never Smoker  . Smokeless tobacco: Never Used  Substance and Sexual Activity  . Alcohol use: Yes    Comment: rare  . Drug use: No  . Sexual activity: Not on file

## 2019-06-07 ENCOUNTER — Telehealth: Payer: Self-pay | Admitting: Orthopedic Surgery

## 2019-06-07 NOTE — Telephone Encounter (Signed)
Pls advise.  

## 2019-06-07 NOTE — Telephone Encounter (Signed)
Patient called. She would like to have the Cyst removed on her thumb.

## 2019-06-08 NOTE — Telephone Encounter (Signed)
Blue sheet done

## 2019-06-10 ENCOUNTER — Telehealth: Payer: Self-pay | Admitting: Orthopedic Surgery

## 2019-06-10 NOTE — Telephone Encounter (Signed)
Patient is scheduled 07-04-19 at [facility] for left thumb cyst removal, release A1 pulley.  She is requesting a call because she has questions regarding the anesthesia used for this procedure ([facility] is indicated on surgery sheet).    Please call [phone].  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

## 2019-06-10 NOTE — Telephone Encounter (Signed)
Please advise. Thanks.  

## 2019-06-13 NOTE — Telephone Encounter (Signed)
I called explained it to her she is good to proceed

## 2019-07-04 ENCOUNTER — Encounter: Payer: Self-pay | Admitting: Orthopedic Surgery

## 2019-07-04 ENCOUNTER — Other Ambulatory Visit: Payer: Self-pay | Admitting: Surgical

## 2019-07-04 ENCOUNTER — Other Ambulatory Visit: Payer: Self-pay

## 2019-07-04 DIAGNOSIS — M67442 Ganglion, left hand: Secondary | ICD-10-CM | POA: Diagnosis not present

## 2019-07-04 MED ORDER — METHOCARBAMOL 500 MG PO TABS: 500.0000 mg | ORAL_TABLET | Freq: Three times a day (TID) | ORAL | 0 refills | Status: DC | PRN

## 2019-07-04 MED ORDER — HYDROCODONE-ACETAMINOPHEN 5-325 MG PO TABS: 1.0000 | ORAL_TABLET | ORAL | 0 refills | Status: DC | PRN

## 2019-07-07 ENCOUNTER — Telehealth: Payer: Self-pay | Admitting: Orthopedic Surgery

## 2019-07-07 NOTE — Telephone Encounter (Signed)
Patient called stating that the dressing Dr. August Saucer put on her thumb is coming off, she wants to know what she should do.  CB#[phone].  Thank you.

## 2019-07-07 NOTE — Telephone Encounter (Signed)
Spoke with patient and advised. Verbalized understanding. 

## 2019-07-07 NOTE — Telephone Encounter (Signed)
Pls advise. Thanks.  

## 2019-07-11 ENCOUNTER — Other Ambulatory Visit: Payer: Self-pay

## 2019-07-11 ENCOUNTER — Ambulatory Visit (INDEPENDENT_AMBULATORY_CARE_PROVIDER_SITE_OTHER): Payer: BC Managed Care – PPO | Admitting: Orthopedic Surgery

## 2019-07-11 ENCOUNTER — Encounter: Payer: Self-pay | Admitting: Orthopedic Surgery

## 2019-07-11 DIAGNOSIS — M79642 Pain in left hand: Secondary | ICD-10-CM

## 2019-07-11 NOTE — Progress Notes (Signed)
Post-Op Visit Note   Patient: Paige Alvarez           Date of Birth: 11-09-1965           MRN: [id number] Visit Date: 07/11/2019 PCP: Blair Heys, MD   Assessment & Plan:  Chief Complaint:  Chief Complaint  Patient presents with  . Post-op Follow-up   Visit Diagnoses:  1. Left hand pain     Plan: [name] is about a week out left thumb cyst removal and A1 pulley release.  On exam incision is intact.  She does have a little bit of numbness in the thumb on the ulnar side.  This is where he had to retract the digital nerve.  Sutures are intact.  They are removed today and Steri-Strips applied.  Okay for range of motion exercises but no lifting.  Come back in 3 weeks for final check.  Follow-Up Instructions: Return in about 3 weeks (around 08/01/2019).   Orders:  No orders of the defined types were placed in this encounter.  No orders of the defined types were placed in this encounter.   Imaging: No results found.  PMFS History: Patient Active Problem List   Diagnosis Date Noted  . Achilles tendon contracture, right 06/01/2017  . Dysuria 07/22/2015  . Microscopic hematuria 07/22/2015  . GERD (gastroesophageal reflux disease) 07/17/2015  . Anxiety 07/17/2015  . Depression 07/17/2015  . Hypercholesteremia 07/17/2015  . IBS (irritable bowel syndrome) 07/17/2015   Past Medical History:  Diagnosis Date  . Allergic rhinitis   . Anemia    iron deficiency anemia   . Anxiety   . Asthma    allergy related   . Diverticulosis   . Family history of adverse reaction to anesthesia    pt stated that her mother had PONV  . GERD (gastroesophageal reflux disease)   . H/O hiatal hernia    repair with weight loss surgery   . Headache   . Hypertension   . IBS (irritable bowel syndrome)   . PONV (postoperative nausea and vomiting)   . RLS (restless legs syndrome)   . Tears of meniscus and ACL of left knee   . Wears glasses   . Wears glasses     Family History  Problem  Relation Age of Onset  . Cancer Father        prostate  . Liver disease Mother   . Breast cancer Paternal Aunt   . Hepatitis C Paternal Grandmother   . Colon cancer Paternal Uncle     Past Surgical History:  Procedure Laterality Date  . achilles lenghtening    . ANTERIOR CRUCIATE LIGAMENT REPAIR Left 09/16/2018   Procedure: LEFT KNEE RECONSTRUCTION ANTERIOR CRUCIATE LIGAMENT (ACL) WITH HAMSTRING AUTOGRAFT MENISCAL REPAIR VS DEBRIDEMENT;  Surgeon: Cammy Copa, MD;  Location: [facility];  Service: Orthopedics;  Laterality: Left;  . CHOLECYSTECTOMY N/A 01/12/2014   Procedure: LAPAROSCOPIC CHOLECYSTECTOMY;  Surgeon: Glenna Fellows, MD;  Location: [facility];  Service: General;  Laterality: N/A;  . COLONOSCOPY WITH ESOPHAGOGASTRODUODENOSCOPY (EGD)    . gastric sleeve wtih hiatal hernia repair     . TONSILLECTOMY    . uterine ablaton      Social History   Occupational History  . Not on file  Tobacco Use  . Smoking status: Never Smoker  . Smokeless tobacco: Never Used  Substance and Sexual Activity  . Alcohol use: Yes    Comment: rare  . Drug use: No  . Sexual activity: Not on  file

## 2019-08-03 ENCOUNTER — Ambulatory Visit: Payer: BC Managed Care – PPO | Admitting: Orthopedic Surgery

## 2019-08-15 ENCOUNTER — Ambulatory Visit (INDEPENDENT_AMBULATORY_CARE_PROVIDER_SITE_OTHER): Payer: BC Managed Care – PPO | Admitting: Orthopedic Surgery

## 2019-08-15 ENCOUNTER — Encounter: Payer: Self-pay | Admitting: Orthopedic Surgery

## 2019-08-15 ENCOUNTER — Other Ambulatory Visit: Payer: Self-pay

## 2019-08-15 VITALS — Ht 64.0 in | Wt 220.0 lb

## 2019-08-15 DIAGNOSIS — M79642 Pain in left hand: Secondary | ICD-10-CM

## 2019-08-21 ENCOUNTER — Encounter: Payer: Self-pay | Admitting: Orthopedic Surgery

## 2019-08-21 NOTE — Progress Notes (Signed)
Post-Op Visit Note   Patient: Paige Alvarez           Date of Birth: 25-Dec-1965           MRN: [id number] Visit Date: 08/15/2019 PCP: Blair Heys, MD   Assessment & Plan:  Chief Complaint:  Chief Complaint  Patient presents with  . Left Thumb - Follow-up    07/04/2019 Left Thumb Cyst Removal, A1 pulley release   Visit Diagnoses:  1. Left hand pain     Plan: Paige Alvarez is a 54 year old patient who underwent left thumb cyst removal and A1 pulley release for 1221.  She is having some slowly improving sensation to the ulnar aspect of the thumb.  On exam there is no more triggering.  Incision is healed.  A little bit of continued postop swelling is present but no erythema or drainage.  I think this will continue to improve.  Follow-up with me as needed.  Follow-Up Instructions: Return if symptoms worsen or fail to improve.   Orders:  No orders of the defined types were placed in this encounter.  No orders of the defined types were placed in this encounter.   Imaging: No results found.  PMFS History: Patient Active Problem List   Diagnosis Date Noted  . Achilles tendon contracture, right 06/01/2017  . Dysuria 07/22/2015  . Microscopic hematuria 07/22/2015  . GERD (gastroesophageal reflux disease) 07/17/2015  . Anxiety 07/17/2015  . Depression 07/17/2015  . Hypercholesteremia 07/17/2015  . IBS (irritable bowel syndrome) 07/17/2015   Past Medical History:  Diagnosis Date  . Allergic rhinitis   . Anemia    iron deficiency anemia   . Anxiety   . Asthma    allergy related   . Diverticulosis   . Family history of adverse reaction to anesthesia    pt stated that her mother had PONV  . GERD (gastroesophageal reflux disease)   . H/O hiatal hernia    repair with weight loss surgery   . Headache   . Hypertension   . IBS (irritable bowel syndrome)   . PONV (postoperative nausea and vomiting)   . RLS (restless legs syndrome)   . Tears of meniscus and ACL of left  knee   . Wears glasses   . Wears glasses     Family History  Problem Relation Age of Onset  . Cancer Father        prostate  . Liver disease Mother   . Breast cancer Paternal Aunt   . Hepatitis C Paternal Grandmother   . Colon cancer Paternal Uncle     Past Surgical History:  Procedure Laterality Date  . achilles lenghtening    . ANTERIOR CRUCIATE LIGAMENT REPAIR Left 09/16/2018   Procedure: LEFT KNEE RECONSTRUCTION ANTERIOR CRUCIATE LIGAMENT (ACL) WITH HAMSTRING AUTOGRAFT MENISCAL REPAIR VS DEBRIDEMENT;  Surgeon: Cammy Copa, MD;  Location: [facility];  Service: Orthopedics;  Laterality: Left;  . CHOLECYSTECTOMY N/A 01/12/2014   Procedure: LAPAROSCOPIC CHOLECYSTECTOMY;  Surgeon: Glenna Fellows, MD;  Location: [facility];  Service: General;  Laterality: N/A;  . COLONOSCOPY WITH ESOPHAGOGASTRODUODENOSCOPY (EGD)    . gastric sleeve wtih hiatal hernia repair     . TONSILLECTOMY    . uterine ablaton      Social History   Occupational History  . Not on file  Tobacco Use  . Smoking status: Never Smoker  . Smokeless tobacco: Never Used  Substance and Sexual Activity  . Alcohol use: Yes    Comment: rare  .  Drug use: No  . Sexual activity: Not on file

## 2019-09-27 ENCOUNTER — Other Ambulatory Visit: Payer: Self-pay

## 2019-09-27 ENCOUNTER — Ambulatory Visit: Payer: BC Managed Care – PPO | Admitting: Physician Assistant

## 2019-09-27 ENCOUNTER — Encounter: Payer: Self-pay | Admitting: Physician Assistant

## 2019-09-27 VITALS — BP 118/81 | HR 75 | Temp 98.1°F | Ht 64.0 in | Wt 220.5 lb

## 2019-09-27 DIAGNOSIS — K589 Irritable bowel syndrome without diarrhea: Secondary | ICD-10-CM

## 2019-09-27 DIAGNOSIS — Z7689 Persons encountering health services in other specified circumstances: Secondary | ICD-10-CM

## 2019-09-27 DIAGNOSIS — F419 Anxiety disorder, unspecified: Secondary | ICD-10-CM

## 2019-09-27 DIAGNOSIS — G4700 Insomnia, unspecified: Secondary | ICD-10-CM | POA: Insufficient documentation

## 2019-09-27 DIAGNOSIS — E7800 Pure hypercholesterolemia, unspecified: Secondary | ICD-10-CM

## 2019-09-27 DIAGNOSIS — J3089 Other allergic rhinitis: Secondary | ICD-10-CM

## 2019-09-27 DIAGNOSIS — Z6837 Body mass index (BMI) 37.0-37.9, adult: Secondary | ICD-10-CM

## 2019-09-27 DIAGNOSIS — Z8709 Personal history of other diseases of the respiratory system: Secondary | ICD-10-CM

## 2019-09-27 DIAGNOSIS — R21 Rash and other nonspecific skin eruption: Secondary | ICD-10-CM

## 2019-09-27 DIAGNOSIS — I10 Essential (primary) hypertension: Secondary | ICD-10-CM

## 2019-09-27 MED ORDER — HYDROXYZINE HCL 50 MG PO TABS: 50.0000 mg | ORAL_TABLET | Freq: Every day | ORAL | 1 refills | Status: DC

## 2019-09-27 NOTE — Assessment & Plan Note (Signed)
-  Hx of statin intolerance so recommend to follow heart healthy diet, weigh loss, and to stay as active as possible. -Plan to check lipid panel with CPE in near future.

## 2019-09-27 NOTE — Patient Instructions (Signed)

## 2019-09-27 NOTE — Progress Notes (Signed)
New Patient Office Visit  Subjective:  Patient ID: Paige Alvarez, female    DOB: [birth year]  Age: 54 y.o. MRN: [id number]  CC:  Chief Complaint  Patient presents with  . Establish Care    HPI Paige Alvarez presents to establish care with complaints of insomnia and rash. [facility]  She is currently taking trazodone 100 mg as needed for insomnia, which does not help much.  Patient reports she has trouble falling asleep and feels like she cannot turn her brain off.  Patient wants to discuss alternative therapies.  In the past she has tried Ambien and Lunesta, and prefers a nonhabit-forming medication. She also reports an itchy rash in lower extremities and upper extremities which developed shortly after returning from the beach. Reports she has been going to the same condo for the past 5 years and has not had issues in the past. She did spend time at the community pool. She has been using Benadryl and hydrocortisone spray which have helped. States she has discontinued some of her medications and is only taking what is on her medication list, and is feeling much better. She is working on dietary and lifestyle changes to help with weight loss. States she is tracking her calories, is eating lean meats and eating carbohydrates in moderation. She ultimately wants to be at 200 pounds and would like to come off her antihypertensive medication if possible. She takes montelukast and fexofenadine on a yearly basis to help with allergies and hayfever but plans to start taking them as needed. She has PMHx of IBS which she is controlling with diet and Miralax, and anxiety which she is managing w/o medication. Reports statin intolerance due to myalgias for hypercholesteremia.    Past Medical History:  Diagnosis Date  . Allergic rhinitis   . Anemia    iron deficiency anemia   . Anxiety   . Asthma    allergy related   . Depression    Phreesia 09/25/2019  .  Diverticulosis   . Family history of adverse reaction to anesthesia    pt stated that her mother had PONV  . GERD (gastroesophageal reflux disease)   . H/O hiatal hernia    repair with weight loss surgery   . Headache   . Hyperlipidemia    Phreesia 09/25/2019  . Hypertension   . IBS (irritable bowel syndrome)   . PONV (postoperative nausea and vomiting)   . RLS (restless legs syndrome)   . Tears of meniscus and ACL of left knee   . Wears glasses   . Wears glasses     Past Surgical History:  Procedure Laterality Date  . achilles lenghtening    . ANTERIOR CRUCIATE LIGAMENT REPAIR Left 09/16/2018   Procedure: LEFT KNEE RECONSTRUCTION ANTERIOR CRUCIATE LIGAMENT (ACL) WITH HAMSTRING AUTOGRAFT MENISCAL REPAIR VS DEBRIDEMENT;  Surgeon: Cammy Copaean, Gregory Scott, MD;  Location: [facility] OR;  Service: Orthopedics;  Laterality: Left;  . CHOLECYSTECTOMY N/A 01/12/2014   Procedure: LAPAROSCOPIC CHOLECYSTECTOMY;  Surgeon: Glenna FellowsBenjamin Hoxworth, MD;  Location: [facility]S;  Service: General;  Laterality: N/A;  . COLONOSCOPY WITH ESOPHAGOGASTRODUODENOSCOPY (EGD)    . gastric sleeve wtih hiatal hernia repair     . TONSILLECTOMY    . uterine ablaton       Family History  Problem Relation Age of Onset  . Cancer Father        prostate  . COPD Father   . Liver disease Mother   .  Asthma Mother   . Depression Mother   . Diabetes Mother   . Hyperlipidemia Mother   . Breast cancer Paternal Aunt   . Hepatitis C Paternal Grandmother   . Colon cancer Paternal Uncle   . Cancer Maternal Grandfather     Social History   Socioeconomic History  . Marital status: Married    Spouse name: Not on file  . Number of children: Not on file  . Years of education: Not on file  . Highest education level: Not on file  Occupational History  . Not on file  Tobacco Use  . Smoking status: Never Smoker  . Smokeless tobacco: Never Used  Vaping Use  . Vaping Use: Never used  Substance and Sexual Activity  . Alcohol use: Yes     Comment: rare  . Drug use: No  . Sexual activity: Yes    Birth control/protection: Surgical  Other Topics Concern  . Not on file  Social History Narrative  . Not on file   Social Determinants of Health   Financial Resource Strain:   . Difficulty of Paying Living Expenses:   Food Insecurity:   . Worried About Programme researcher, broadcasting/film/video in the Last Year:   . Barista in the Last Year:   Transportation Needs:   . Freight forwarder (Medical):   Marland Kitchen Lack of Transportation (Non-Medical):   Physical Activity:   . Days of Exercise per Week:   . Minutes of Exercise per Session:   Stress:   . Feeling of Stress :   Social Connections:   . Frequency of Communication with Friends and Family:   . Frequency of Social Gatherings with Friends and Family:   . Attends Religious Services:   . Active Member of Clubs or Organizations:   . Attends Banker Meetings:   Marland Kitchen Marital Status:   Intimate Partner Violence:   . Fear of Current or Ex-Partner:   . Emotionally Abused:   Marland Kitchen Physically Abused:   . Sexually Abused:     ROS Review of Systems Review of Systems:  A fourteen system review of systems was performed and found to be positive as per HPI.  Objective:   Today's Vitals: BP 118/81   Pulse 75   Temp 98.1 F (36.7 C) (Oral)   Ht 5\' 4"  (1.626 m)   Wt 220 lb 8 oz (100 kg)   SpO2 97% Comment: on RA  BMI 37.85 kg/m   Physical Exam General:  Well Developed, well nourished, appropriate for stated age.  Neuro:  Alert and oriented,  extra-ocular muscles intact  HEENT:  Normocephalic, atraumatic, neck supple Skin:  Small red, raised papules on B/L LE Cardiac:  RRR, S1 S2 Respiratory:  ECTA B/L and A/P, Not using accessory muscles, speaking in full sentences- unlabored. Vascular:  Ext warm, no cyanosis apprec.; cap RF less 2 sec, no edema present Psych:  No HI/SI, judgement and insight good, Euthymic mood. Full Affect.  Assessment & Plan:   Problem List Items  Addressed This Visit      Cardiovascular and Mediastinum   Hypertension    - BP today is 118/81 HR 75, at goal. - Continue Amlodipine 5 mg - Encourage ambulatory BP and pulse monitoring. - Follow DASH diet. - Encourage to stay as active as possible.         Digestive   IBS (irritable bowel syndrome)    -Followed by [facility]. -Under control -Continue Miralax and dietary modifications.  Other   Anxiety   Relevant Medications   hydrOXYzine (ATARAX/VISTARIL) 50 MG tablet   Hypercholesteremia    -Hx of statin intolerance so recommend to follow heart healthy diet, weigh loss, and to stay as active as possible. -Plan to check lipid panel with CPE in near future.      Insomnia    -Patient has failed several medications. Discontinued Trazodone and started hydroxyzine 50 mg.  -Encourage to incorporate nonpharmacologic therapy such as good sleep hygiene, exercise, and meditation.       Relevant Medications   hydrOXYzine (ATARAX/VISTARIL) 50 MG tablet    Other Visit Diagnoses    Encounter to establish care    -  Primary   Rash       Environmental and seasonal allergies       History of hay fever       BMI 37.0-37.9, adult         Anxiety: -Stable and managing without medication. -Recommend mindfulness therapy, exercise, and relaxation techniques.  Environmental seasonal allergies, history of hayfever: -Stable. -Continue Singulair 10 mg and fexofenadine 180 mg. Okay to trial medications per season instead of year long.  Rash: -Etiology unclear but could be related to possible irritation from exposure to chemicals from the pool. -Improving, continue to use Benadryl and hydrocortisone spray as needed.  BMI 37.0-37.9, adult: -Continue with dietary and lifestyle changes. -Continue to track calories. -Will continue to monitor.    Outpatient Encounter Medications as of 09/27/2019  Medication Sig  . amLODipine (NORVASC) 5 MG tablet Take 5 mg by mouth  every morning.  . fexofenadine (ALLEGRA) 180 MG tablet Take 180 mg by mouth daily.   . montelukast (SINGULAIR) 10 MG tablet Take 10 mg by mouth every morning.  . [DISCONTINUED] traZODone (DESYREL) 50 MG tablet Take 100 mg by mouth at bedtime.  . hydrOXYzine (ATARAX/VISTARIL) 50 MG tablet Take 1 tablet (50 mg total) by mouth at bedtime.  . [DISCONTINUED] Bempedoic Acid (NEXLETOL) 180 MG TABS Take 180 mg by mouth daily.  . [DISCONTINUED] Biotin 10 MG TABS Take 10 mg by mouth daily.  . [DISCONTINUED] Calcium-Vitamin D-Vitamin K (VIACTIV CALCIUM PLUS D) 650-12.5-40 MG-MCG-MCG CHEW Chew 1 tablet by mouth 2 (two) times a day.  . [DISCONTINUED] escitalopram (LEXAPRO) 10 MG tablet Take 10 mg by mouth daily.   . [DISCONTINUED] GAVILYTE-N WITH FLAVOR PACK 420 g solution take by mouth as directed  . [DISCONTINUED] HYDROcodone-acetaminophen (NORCO/VICODIN) 5-325 MG tablet Take 1 tablet by mouth every 4 (four) hours as needed for moderate pain.  . [DISCONTINUED] methocarbamol (ROBAXIN) 500 MG tablet Take 1 tablet (500 mg total) by mouth every 8 (eight) hours as needed for muscle spasms.  . [DISCONTINUED] metoprolol succinate (TOPROL-XL) 25 MG 24 hr tablet Take 25 mg by mouth daily.  . [DISCONTINUED] Naproxen Sodium (ALEVE) 220 MG CAPS Take 440 mg by mouth daily as needed (pain).   . [DISCONTINUED] omeprazole (PRILOSEC) 20 MG capsule Take 20 mg by mouth every morning.  . [DISCONTINUED] ondansetron (ZOFRAN) 4 MG tablet Take 4 mg by mouth every 8 (eight) hours as needed for nausea or vomiting.  . [DISCONTINUED] oxyCODONE (OXY IR/ROXICODONE) 5 MG immediate release tablet Take 1 tablet (5 mg total) by mouth every 4 (four) hours as needed for severe pain.  . [DISCONTINUED] polyethylene glycol powder (GLYCOLAX/MIRALAX) powder Take 17 g by mouth 2 (two) times daily. Until daily soft stools  OTC (Patient taking differently: Take 1 Container by mouth every other day. )  . [DISCONTINUED]  triamcinolone cream (KENALOG)  0.1 % Apply 1 application topically 2 (two) times daily as needed (eczema).   No facility-administered encounter medications on file as of 09/27/2019.    Follow-up: Return for Insomnia- new med started in 2 months, CPE and FBW in 4-6 months.    Note:  This note was prepared with assistance of Dragon voice recognition software. Occasional wrong-word or sound-a-like substitutions may have occurred due to the inherent limitations of voice recognition software.  Mayer Masker, PA-C

## 2019-09-27 NOTE — Assessment & Plan Note (Signed)
-   BP today is 118/81 HR 75, at goal. - Continue Amlodipine 5 mg - Encourage ambulatory BP and pulse monitoring. - Follow DASH diet. - Encourage to stay as active as possible.

## 2019-09-27 NOTE — Assessment & Plan Note (Addendum)
-  Patient has failed several medications. Discontinued Trazodone and started hydroxyzine 50 mg.  -Encourage to incorporate nonpharmacologic therapy such as good sleep hygiene, exercise, and meditation.

## 2019-09-27 NOTE — Assessment & Plan Note (Signed)
-  Followed by [facility]. -Under control -Continue Miralax and dietary modifications.

## 2019-09-29 ENCOUNTER — Telehealth: Payer: Self-pay | Admitting: Orthopedic Surgery

## 2019-09-29 NOTE — Telephone Encounter (Signed)
I called s/w patient and she states that the cyst that she had surgically removed earlier this year has started coming back over the last week. She would like to discuss with you. She states it is not painful but concerned that it has returned.

## 2019-09-29 NOTE — Telephone Encounter (Signed)
Patient called requesting a call back from Dr. Diamantina Providence nurse. Patient has a medical question. Patient phone number is [phone].

## 2019-09-30 ENCOUNTER — Telehealth: Payer: Self-pay | Admitting: Physician Assistant

## 2019-09-30 DIAGNOSIS — G4700 Insomnia, unspecified: Secondary | ICD-10-CM

## 2019-09-30 NOTE — Addendum Note (Signed)
Addended by: Sylvester Harder on: 09/30/2019 12:33 PM   Modules accepted: Orders

## 2019-09-30 NOTE — Telephone Encounter (Signed)
Spoke with Kandis Cocking who suggests referral to sleep clinic for sleep studies since patient has failed several sleep aid medications.   Patient is agreeable to referral. AS, CMA

## 2019-09-30 NOTE — Telephone Encounter (Signed)
I called Paige Alvarez.  She sent me a picture of her thumb.  There may be a recurrence of that cystic lesion.  Its not symptomatic at this time.  Recommended follow-up in 3 months with ultrasound examination then.

## 2019-09-30 NOTE — Telephone Encounter (Signed)
Hydroxine is not helping patient fall asleep at night (which is the issue), states that it takes her more than an hour to fall asleep, but makes her groggy in the mornings. She would like to try something different.    [facility].   AS, CMA

## 2019-09-30 NOTE — Telephone Encounter (Signed)
Patient called requesting to speak with clinic staff about a med she was prescribed at an OV earlier this week. CMA unavailable at time of call (rooming a patient). Please contact patient when available about her med concern.

## 2019-10-31 ENCOUNTER — Ambulatory Visit: Payer: BC Managed Care – PPO | Admitting: Orthopedic Surgery

## 2019-11-23 ENCOUNTER — Ambulatory Visit: Payer: BC Managed Care – PPO | Admitting: Orthopedic Surgery

## 2019-11-23 ENCOUNTER — Encounter: Payer: Self-pay | Admitting: Orthopedic Surgery

## 2019-11-23 DIAGNOSIS — M79642 Pain in left hand: Secondary | ICD-10-CM

## 2019-11-27 ENCOUNTER — Encounter: Payer: Self-pay | Admitting: Orthopedic Surgery

## 2019-11-27 NOTE — Progress Notes (Signed)
Post-Op Visit Note   Patient: Paige Alvarez           Date of Birth: 21-Jan-1966           MRN: [id number] Visit Date: 11/23/2019 PCP: Mayer Masker, PA-C   Assessment & Plan:  Chief Complaint:  Chief Complaint  Patient presents with  . Left Thumb - Pain   Visit Diagnoses:  1. Pain in left hand     Plan: Gentri is a 54 year old patient with left thumb cyst removal for 1221.  She states that the cyst may be recurring.  Not really symptomatic but she does report some difficulty at times with grip strengthening.  She tried topical Voltaren.  On examination the patient has intact sensation on the radial and volar and dorsal and ulnar aspect of the thumb.  Incision is well-healed from the prior A1 pulley release and cyst removal.  The cyst was around the nerve.  Ultrasound examination does not really show a clear cystic structure but there is something palpable area which is about 3 to 4 mm in size at most.  Negative Tinel's around this lesion.  Plan is to month return for repeat evaluation and consideration of potential repeat excision.  May have fellowship trained hand surgeon look at that because of its involvement with the nerve.  Follow-Up Instructions: Return in about 8 weeks (around 01/18/2020).   Orders:  No orders of the defined types were placed in this encounter.  No orders of the defined types were placed in this encounter.   Imaging: No results found.  PMFS History: Patient Active Problem List   Diagnosis Date Noted  . Hypertension 09/27/2019  . Insomnia 09/27/2019  . Achilles tendon contracture, right 06/01/2017  . Dysuria 07/22/2015  . Microscopic hematuria 07/22/2015  . GERD (gastroesophageal reflux disease) 07/17/2015  . Anxiety 07/17/2015  . Depression 07/17/2015  . Hypercholesteremia 07/17/2015  . IBS (irritable bowel syndrome) 07/17/2015   Past Medical History:  Diagnosis Date  . Allergic rhinitis   . Anemia    iron deficiency anemia   .  Anxiety   . Asthma    allergy related   . Depression    Phreesia 09/25/2019  . Diverticulosis   . Family history of adverse reaction to anesthesia    pt stated that her mother had PONV  . GERD (gastroesophageal reflux disease)   . H/O hiatal hernia    repair with weight loss surgery   . Headache   . Hyperlipidemia    Phreesia 09/25/2019  . Hypertension   . IBS (irritable bowel syndrome)   . PONV (postoperative nausea and vomiting)   . RLS (restless legs syndrome)   . Tears of meniscus and ACL of left knee   . Wears glasses   . Wears glasses     Family History  Problem Relation Age of Onset  . Cancer Father        prostate  . COPD Father   . Liver disease Mother   . Asthma Mother   . Depression Mother   . Diabetes Mother   . Hyperlipidemia Mother   . Breast cancer Paternal Aunt   . Hepatitis C Paternal Grandmother   . Colon cancer Paternal Uncle   . Cancer Maternal Grandfather     Past Surgical History:  Procedure Laterality Date  . achilles lenghtening    . ANTERIOR CRUCIATE LIGAMENT REPAIR Left 09/16/2018   Procedure: LEFT KNEE RECONSTRUCTION ANTERIOR CRUCIATE LIGAMENT (ACL) WITH HAMSTRING AUTOGRAFT MENISCAL REPAIR  VS DEBRIDEMENT;  Surgeon: Cammy Copa, MD;  Location: [facility] OR;  Service: Orthopedics;  Laterality: Left;  . CHOLECYSTECTOMY N/A 01/12/2014   Procedure: LAPAROSCOPIC CHOLECYSTECTOMY;  Surgeon: Glenna Fellows, MD;  Location: [facility];  Service: General;  Laterality: N/A;  . COLONOSCOPY WITH ESOPHAGOGASTRODUODENOSCOPY (EGD)    . gastric sleeve wtih hiatal hernia repair     . TONSILLECTOMY    . uterine ablaton      Social History   Occupational History  . Not on file  Tobacco Use  . Smoking status: Never Smoker  . Smokeless tobacco: Never Used  Vaping Use  . Vaping Use: Never used  Substance and Sexual Activity  . Alcohol use: Yes    Comment: rare  . Drug use: No  . Sexual activity: Yes    Birth control/protection: Surgical

## 2019-12-12 ENCOUNTER — Ambulatory Visit: Payer: BC Managed Care – PPO | Admitting: Physician Assistant

## 2020-01-23 ENCOUNTER — Ambulatory Visit (INDEPENDENT_AMBULATORY_CARE_PROVIDER_SITE_OTHER): Payer: BC Managed Care – PPO | Admitting: Orthopedic Surgery

## 2020-01-23 ENCOUNTER — Other Ambulatory Visit: Payer: Self-pay

## 2020-01-23 ENCOUNTER — Encounter: Payer: Self-pay | Admitting: Orthopedic Surgery

## 2020-01-23 DIAGNOSIS — M79642 Pain in left hand: Secondary | ICD-10-CM

## 2020-01-29 ENCOUNTER — Encounter: Payer: Self-pay | Admitting: Orthopedic Surgery

## 2020-01-29 NOTE — Progress Notes (Signed)
Post-Op Visit Note   Patient: Paige Alvarez           Date of Birth: 13-Nov-1965           MRN: [id number] Visit Date: 01/23/2020 PCP: Mayer Masker, PA-C   Assessment & Plan:  Chief Complaint:  Chief Complaint  Patient presents with  . Left Thumb - Pain   Visit Diagnoses:  1. Pain in left hand     Plan: Yadira is a 54 year old patient with left thumb follow-up. She had triggering as well as a cyst. Triggering has resolved but the cyst has recurred. Overall doing reasonably well but the left thumb is bothering her when she is gripping things. Its the cyst on that ulnar aspect that is getting in the way. Denies any numbness and tingling on the ulnar or radial aspect of the thumb. On exam she has excellent thumb IP flexion and extension. Does have a recurrent cyst distal to the A1 pulley release incision. Negative Tinel's over the cyst.  Impression is recurrent cyst formation in the area of the A1 pulley release. I think the digital nerve was intimately involved with the cyst on the first excision. To reexcise the cyst would require careful dissection of that sensory nerve to the thumb on the ulnar aspect. I would like for her to get another opinion from Dr. Valeria Batman about appropriateness of excision of the cyst at this time.  Follow-Up Instructions: Return if symptoms worsen or fail to improve.   Orders:  Orders Placed This Encounter  Procedures  . Ambulatory referral to Orthopedic Surgery   No orders of the defined types were placed in this encounter.   Imaging: No results found.  PMFS History: Patient Active Problem List   Diagnosis Date Noted  . Hypertension 09/27/2019  . Insomnia 09/27/2019  . Achilles tendon contracture, right 06/01/2017  . Dysuria 07/22/2015  . Microscopic hematuria 07/22/2015  . GERD (gastroesophageal reflux disease) 07/17/2015  . Anxiety 07/17/2015  . Depression 07/17/2015  . Hypercholesteremia 07/17/2015  . IBS (irritable bowel  syndrome) 07/17/2015   Past Medical History:  Diagnosis Date  . Allergic rhinitis   . Anemia    iron deficiency anemia   . Anxiety   . Asthma    allergy related   . Depression    Phreesia 09/25/2019  . Diverticulosis   . Family history of adverse reaction to anesthesia    pt stated that her mother had PONV  . GERD (gastroesophageal reflux disease)   . H/O hiatal hernia    repair with weight loss surgery   . Headache   . Hyperlipidemia    Phreesia 09/25/2019  . Hypertension   . IBS (irritable bowel syndrome)   . PONV (postoperative nausea and vomiting)   . RLS (restless legs syndrome)   . Tears of meniscus and ACL of left knee   . Wears glasses   . Wears glasses     Family History  Problem Relation Age of Onset  . Cancer Father        prostate  . COPD Father   . Liver disease Mother   . Asthma Mother   . Depression Mother   . Diabetes Mother   . Hyperlipidemia Mother   . Breast cancer Paternal Aunt   . Hepatitis C Paternal Grandmother   . Colon cancer Paternal Uncle   . Cancer Maternal Grandfather     Past Surgical History:  Procedure Laterality Date  . achilles lenghtening    .  ANTERIOR CRUCIATE LIGAMENT REPAIR Left 09/16/2018   Procedure: LEFT KNEE RECONSTRUCTION ANTERIOR CRUCIATE LIGAMENT (ACL) WITH HAMSTRING AUTOGRAFT MENISCAL REPAIR VS DEBRIDEMENT;  Surgeon: Cammy Copa, MD;  Location: [facility] OR;  Service: Orthopedics;  Laterality: Left;  . CHOLECYSTECTOMY N/A 01/12/2014   Procedure: LAPAROSCOPIC CHOLECYSTECTOMY;  Surgeon: Glenna Fellows, MD;  Location: [facility];  Service: General;  Laterality: N/A;  . COLONOSCOPY WITH ESOPHAGOGASTRODUODENOSCOPY (EGD)    . gastric sleeve wtih hiatal hernia repair     . TONSILLECTOMY    . uterine ablaton      Social History   Occupational History  . Not on file  Tobacco Use  . Smoking status: Never Smoker  . Smokeless tobacco: Never Used  Vaping Use  . Vaping Use: Never used  Substance and Sexual Activity  .  Alcohol use: Yes    Comment: rare  . Drug use: No  . Sexual activity: Yes    Birth control/protection: Surgical

## 2020-02-06 ENCOUNTER — Ambulatory Visit (INDEPENDENT_AMBULATORY_CARE_PROVIDER_SITE_OTHER): Payer: BC Managed Care – PPO | Admitting: Physician Assistant

## 2020-02-06 ENCOUNTER — Other Ambulatory Visit: Payer: Self-pay

## 2020-02-06 ENCOUNTER — Encounter: Payer: Self-pay | Admitting: Physician Assistant

## 2020-02-06 VITALS — BP 115/82 | HR 88 | Temp 98.6°F | Ht 63.75 in | Wt 192.3 lb

## 2020-02-06 DIAGNOSIS — E669 Obesity, unspecified: Secondary | ICD-10-CM | POA: Diagnosis not present

## 2020-02-06 DIAGNOSIS — Z0000 Encounter for general adult medical examination without abnormal findings: Secondary | ICD-10-CM | POA: Diagnosis not present

## 2020-02-06 DIAGNOSIS — Z2821 Immunization not carried out because of patient refusal: Secondary | ICD-10-CM

## 2020-02-06 DIAGNOSIS — I10 Essential (primary) hypertension: Secondary | ICD-10-CM

## 2020-02-06 DIAGNOSIS — E7800 Pure hypercholesterolemia, unspecified: Secondary | ICD-10-CM

## 2020-02-06 DIAGNOSIS — Z6833 Body mass index (BMI) 33.0-33.9, adult: Secondary | ICD-10-CM

## 2020-02-06 NOTE — Progress Notes (Signed)
Female Physical   Impression and Recommendations:    1. Healthcare maintenance   2. Hypercholesteremia   3. Hypertension, unspecified type   4. Class 1 obesity with serious comorbidity and body mass index (BMI) of 33.0 to 33.9 in adult, unspecified obesity type   5. Influenza vaccination declined      1) Anticipatory Guidance: Discussed skin CA prevention and sunscreen when outside along with skin surveillance; eating a balanced and modest diet; physical activity at least 25 minutes per day or minimum of 150 min/ week moderate to intense activity.  2) Immunizations / Screenings / Labs:   All immunizations are up-to-date per recommendations or will be updated today if pt allows.    - Patient understands with dental and vision screens they will schedule independently.  - Will obtain CBC, CMP, HgA1c, Lipid panel, and TSH when fasting. - Patient plans to call next week to schedule annual mammogram. - UTD on pap smear - Declined Tdap, Shingrix, and influenza vaccines.    3) Weight:  Discussed goal to improve diet habits to improve overall feelings of well being and objective health data. Improve nutrient density of diet through increasing intake of fruits and vegetables and decreasing saturated fats, white flour products and refined sugars. - Patient has lost approximately 27 pounds since last OV. Encourage to continue with dietary changes, calorie monitoring and physical activity. -BMI 33.27, associated with hypertension and hypercholesteremia.   4) Healthcare Maintenance: -Continue current medication regimen. -Stay well hydrated. -Follow up in 4 months for reg OV: HTN, allergies   No orders of the defined types were placed in this encounter.   Orders Placed This Encounter  Procedures  . CBC with Differential/Platelet  . Comprehensive metabolic panel  . Hemoglobin A1c  . Lipid panel  . TSH     Return in about 4 months (around 06/05/2020) for HTN, allergies .    Gross  side effects, risk and benefits, and alternatives of medications discussed with patient.  Patient is aware that all medications have potential side effects and we are unable to predict every side effect or drug-drug interaction that may occur.  Expresses verbal understanding and consents to current therapy plan and treatment regimen.  F-up preventative CPE in 1 year- this is in addition to any chronic care visits.    Please see orders placed and AVS handed out to patient at the end of our visit for further patient instructions/ counseling done pertaining to today's office visit.  Note:  This note was prepared with assistance of Dragon voice recognition software. Occasional wrong-word or sound-a-like substitutions may have occurred due to the inherent limitations of voice recognition software.    Subjective:     CPE HPI: Paige Alvarez is a 54 y.o. female who presents to [facility] today a yearly health maintenance exam.   Health Maintenance Summary  - Reviewed and updated, unless pt declines services.  Last Cologuard or Colonoscopy:   08/09/2015- repeat in 10 yrs Tobacco History Reviewed:  Y, never a smoker  Alcohol and/or drug use:   No concerns; no use Exercise Habits: Moderate physical activity  Dental Home:Y Eye exams:Y Dermatology home:N  Female Health:  PAP Smear - last known results:  04/12/18- normal STD concerns:   none Birth control method: uterine ablation  Lumps or breast concerns:  none Breast Cancer Family History:  Y, paternal aunt   Additional concerns beyond health maintenance issues: none    There is no immunization  history on file for this patient.   Health Maintenance  Topic Date Due  . Hepatitis C Screening  Never done  . COVID-19 Vaccine (1) Never done  . HIV Screening  Never done  . TETANUS/TDAP  Never done  . INFLUENZA VACCINE  Never done  . MAMMOGRAM  02/08/2021  . PAP SMEAR-Modifier  04/12/2021  . COLONOSCOPY   08/08/2025     Wt Readings from Last 3 Encounters:  02/06/20 192 lb 4.8 oz (87.2 kg)  09/27/19 220 lb 8 oz (100 kg)  08/15/19 220 lb (99.8 kg)   BP Readings from Last 3 Encounters:  02/06/20 115/82  09/27/19 118/81  09/16/18 (!) 91/55   Pulse Readings from Last 3 Encounters:  02/06/20 88  09/27/19 75  09/16/18 60     Past Medical History:  Diagnosis Date  . Allergic rhinitis   . Anemia    iron deficiency anemia   . Anxiety   . Asthma    allergy related   . Depression    Phreesia 09/25/2019  . Diverticulosis   . Family history of adverse reaction to anesthesia    pt stated that her mother had PONV  . GERD (gastroesophageal reflux disease)   . H/O hiatal hernia    repair with weight loss surgery   . Headache   . Hyperlipidemia    Phreesia 09/25/2019  . Hypertension   . IBS (irritable bowel syndrome)   . PONV (postoperative nausea and vomiting)   . RLS (restless legs syndrome)   . Tears of meniscus and ACL of left knee   . Wears glasses   . Wears glasses       Past Surgical History:  Procedure Laterality Date  . achilles lenghtening    . ANTERIOR CRUCIATE LIGAMENT REPAIR Left 09/16/2018   Procedure: LEFT KNEE RECONSTRUCTION ANTERIOR CRUCIATE LIGAMENT (ACL) WITH HAMSTRING AUTOGRAFT MENISCAL REPAIR VS DEBRIDEMENT;  Surgeon: Cammy Copa, MD;  Location: [facility];  Service: Orthopedics;  Laterality: Left;  . CHOLECYSTECTOMY N/A 01/12/2014   Procedure: LAPAROSCOPIC CHOLECYSTECTOMY;  Surgeon: Glenna Fellows, MD;  Location: [facility];  Service: General;  Laterality: N/A;  . COLONOSCOPY WITH ESOPHAGOGASTRODUODENOSCOPY (EGD)    . gastric sleeve wtih hiatal hernia repair     . TONSILLECTOMY    . uterine ablaton         Family History  Problem Relation Age of Onset  . Cancer Father        prostate  . COPD Father   . Liver disease Mother   . Asthma Mother   . Depression Mother   . Diabetes Mother   . Hyperlipidemia Mother   . Breast cancer Paternal  Aunt   . Hepatitis C Paternal Grandmother   . Colon cancer Paternal Uncle   . Cancer Maternal Grandfather       Social History   Substance and Sexual Activity  Drug Use No  ,   Social History   Substance and Sexual Activity  Alcohol Use Yes   Comment: rare  ,   Social History   Tobacco Use  Smoking Status Never Smoker  Smokeless Tobacco Never Used  ,   Social History   Substance and Sexual Activity  Sexual Activity Yes  . Birth control/protection: Surgical    Current Outpatient Medications on File Prior to Visit  Medication Sig Dispense Refill  . amLODipine (NORVASC) 5 MG tablet Take 5 mg by mouth every morning.    . fexofenadine (ALLEGRA) 180 MG tablet Take 180  mg by mouth daily.     . montelukast (SINGULAIR) 10 MG tablet Take 10 mg by mouth every morning.     No current facility-administered medications on file prior to visit.    Allergies: Other, Adhesive [tape], Cortisone, Percocet [oxycodone-acetaminophen], and Statins  Review of Systems: General:   Denies fever, chills, unexplained weight loss.  Optho/Auditory:   Denies visual changes, blurred vision/LOV Respiratory:   Denies SOB, DOE more than baseline levels.   Cardiovascular:   Denies chest pain, palpitations, new onset peripheral edema  Gastrointestinal:   Denies nausea, vomiting, diarrhea.  Genitourinary: Denies dysuria, freq/ urgency, flank pain or discharge from genitals.  Endocrine:     Denies hot or cold intolerance, polyuria, polydipsia. Musculoskeletal:   Denies unexplained myalgias, joint swelling, unexplained arthralgias, gait problems.  Skin:  Denies rash, suspicious lesions Neurological:     Denies dizziness, unexplained weakness, numbness  Psychiatric/Behavioral:   Denies mood changes, suicidal or homicidal ideations, hallucinations    Objective:    Blood pressure 115/82, pulse 88, temperature 98.6 F (37 C), temperature source Oral, height 5' 3.75" (1.619 m), weight 192 lb 4.8 oz  (87.2 kg), SpO2 99 %. Body mass index is 33.27 kg/m. General Appearance:    Alert, pleasant and cooperative, no distress, appears stated age  Head:    Normocephalic, without obvious abnormality, atraumatic  Eyes:    PERRL, conjunctiva/corneas clear, EOM's intact, both eyes  Ears:    Normal TM's and external ear canals, both ears  Nose:   Nares normal, septum midline, mucosa normal, no drainage    or sinus tenderness  Throat:   Lips w/o lesion, mucosa moist, and tongue normal; teeth and   gums normal  Neck:   Supple, symmetrical, trachea midline, no adenopathy;    thyroid:  no enlargement/tenderness/nodules  Back:     Symmetric, no curvature, ROM normal, no CVA tenderness  Lungs:     Clear to auscultation bilaterally, respirations unlabored, no       Wh/ R/ R  Chest Wall:    No tenderness or gross deformity; normal excursion   Heart:    Regular rate and rhythm, S1 and S2 normal, no murmur, rub   or gallop  Breast Exam:    No tenderness, masses, or nipple abnormality b/l; no d/c; dense breast tissue noted  Abdomen:     Soft, non-tender, bowel sounds active all four quadrants, No  G/R/R, no masses, no organomegaly  Genitalia:   Deferred, UTD on pap, no concerns/sxs  Rectal:   Deferred.  Extremities:   Extremities normal, atraumatic, no cyanosis or gross edema  Pulses:   2+ and symmetric all extremities  Skin:   Warm, dry, Skin color, texture, turgor normal, no obvious rashes or lesions Psych: No HI/SI, judgement and insight good, Euthymic mood. Full Affect.  Neurologic:   CNII-XII grossly intact, normal strength, sensation and reflexes throughout

## 2020-02-06 NOTE — Patient Instructions (Signed)

## 2020-02-07 LAB — COMPREHENSIVE METABOLIC PANEL
ALT: 18 IU/L (ref 0–32)
AST: 20 IU/L (ref 0–40)
Albumin/Globulin Ratio: 2.1 (ref 1.2–2.2)
Albumin: 4.7 g/dL (ref 3.8–4.9)
Alkaline Phosphatase: 82 IU/L (ref 44–121)
BUN/Creatinine Ratio: 15 (ref 9–23)
BUN: 10 mg/dL (ref 6–24)
Bilirubin Total: 0.4 mg/dL (ref 0.0–1.2)
CO2: 24 mmol/L (ref 20–29)
Calcium: 9.5 mg/dL (ref 8.7–10.2)
Chloride: 100 mmol/L (ref 96–106)
Creatinine, Ser: 0.66 mg/dL (ref 0.57–1.00)
GFR calc Af Amer: 115 mL/min/{1.73_m2} (ref >59)
GFR calc non Af Amer: 100 mL/min/{1.73_m2} (ref >59)
Globulin, Total: 2.2 g/dL (ref 1.5–4.5)
Glucose: 89 mg/dL (ref 65–99)
Potassium: 3.9 mmol/L (ref 3.5–5.2)
Sodium: 140 mmol/L (ref 134–144)
Total Protein: 6.9 g/dL (ref 6.0–8.5)

## 2020-02-07 LAB — CBC WITH DIFFERENTIAL/PLATELET
Basophils Absolute: 0.1 10*3/uL (ref 0.0–0.2)
Basos: 1 %
EOS (ABSOLUTE): 0.3 10*3/uL (ref 0.0–0.4)
Eos: 5 %
Hematocrit: 44.3 % (ref 34.0–46.6)
Hemoglobin: 14.5 g/dL (ref 11.1–15.9)
Immature Grans (Abs): 0 10*3/uL (ref 0.0–0.1)
Immature Granulocytes: 0 %
Lymphocytes Absolute: 2.1 10*3/uL (ref 0.7–3.1)
Lymphs: 36 %
MCH: 28.7 pg (ref 26.6–33.0)
MCHC: 32.7 g/dL (ref 31.5–35.7)
MCV: 88 fL (ref 79–97)
Monocytes Absolute: 0.5 10*3/uL (ref 0.1–0.9)
Monocytes: 9 %
Neutrophils Absolute: 2.9 10*3/uL (ref 1.4–7.0)
Neutrophils: 49 %
Platelets: 281 10*3/uL (ref 150–450)
RBC: 5.05 x10E6/uL (ref 3.77–5.28)
RDW: 13.3 % (ref 11.7–15.4)
WBC: 5.8 10*3/uL (ref 3.4–10.8)

## 2020-02-07 LAB — HEMOGLOBIN A1C
Est. average glucose Bld gHb Est-mCnc: 120 mg/dL
Hgb A1c MFr Bld: 5.8 % — ABNORMAL HIGH (ref 4.8–5.6)

## 2020-02-07 LAB — LIPID PANEL
Chol/HDL Ratio: 4.2 ratio (ref 0.0–4.4)
Cholesterol, Total: 215 mg/dL — ABNORMAL HIGH (ref 100–199)
HDL: 51 mg/dL (ref >39)
LDL Chol Calc (NIH): 146 mg/dL — ABNORMAL HIGH (ref 0–99)
Triglycerides: 101 mg/dL (ref 0–149)
VLDL Cholesterol Cal: 18 mg/dL (ref 5–40)

## 2020-02-07 LAB — TSH: TSH: 1.94 u[IU]/mL (ref 0.450–4.500)

## 2020-02-10 ENCOUNTER — Other Ambulatory Visit: Payer: Self-pay

## 2020-02-23 ENCOUNTER — Other Ambulatory Visit: Payer: Self-pay | Admitting: Physician Assistant

## 2020-02-23 DIAGNOSIS — Z1231 Encounter for screening mammogram for malignant neoplasm of breast: Secondary | ICD-10-CM

## 2020-04-05 ENCOUNTER — Ambulatory Visit: Payer: BC Managed Care – PPO

## 2020-05-12 ENCOUNTER — Other Ambulatory Visit: Payer: Self-pay

## 2020-05-12 ENCOUNTER — Ambulatory Visit
Admission: RE | Admit: 2020-05-12 | Discharge: 2020-05-12 | Disposition: A | Discharge status: Home / Self Care | Payer: BC Managed Care – PPO | Source: Ambulatory Visit | Attending: Physician Assistant | Admitting: Physician Assistant

## 2020-05-12 DIAGNOSIS — Z1231 Encounter for screening mammogram for malignant neoplasm of breast: Secondary | ICD-10-CM

## 2020-06-05 ENCOUNTER — Ambulatory Visit: Payer: BC Managed Care – PPO | Admitting: Physician Assistant

## 2020-07-26 ENCOUNTER — Telehealth: Payer: Self-pay | Admitting: Physician Assistant

## 2020-07-26 ENCOUNTER — Ambulatory Visit: Payer: BC Managed Care – PPO | Admitting: Physician Assistant

## 2020-07-26 NOTE — Telephone Encounter (Signed)
Patient would like a prescription that has not been filled here and they are eye drops for allergies. The name of the prescription is azelastine Hcl. Patient uses [facility]. Please advise, thanks.

## 2020-07-26 NOTE — Telephone Encounter (Signed)
Pt will need to schedule an appointment to discuss issue and reason for medication with provider since we have not prescribed this medication before. AS, CMA

## 2020-07-27 NOTE — Telephone Encounter (Signed)
Left message for patient

## 2020-08-07 ENCOUNTER — Ambulatory Visit: Payer: BC Managed Care – PPO | Admitting: Nurse Practitioner

## 2020-09-20 IMAGING — MR MRI OF THE LEFT KNEE WITHOUT CONTRAST
4 of 6 series · 21 of 40 positions shown · non-contrast
Comparison: Plain films left knee 09/06/2018 from [REDACTED].

CLINICAL DATA: Left knee injury 5 days ago when the patient jumped
out of a truck bed and twisted her knee. Diffuse pain and swelling.
Initial encounter.

EXAM:
MRI OF THE LEFT KNEE WITHOUT CONTRAST
TECHNIQUE: Multiplanar, multisequence MR imaging of the knee was performed. No
intravenous contrast was administered.

[Series 3: T2 fat-sat · axial · 4.0mm · 0.50mm/px · z∈[-76,+39]mm · 4 of 24 slices shown (1 of 2)]
[im 1/24]
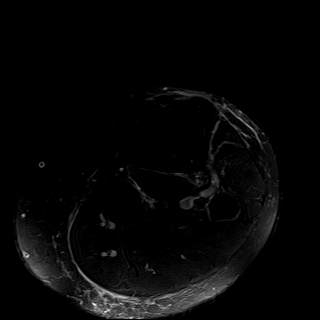
[im 6/24]
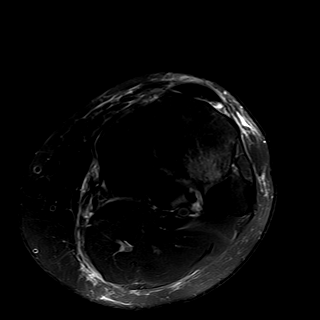
[im 12/24]
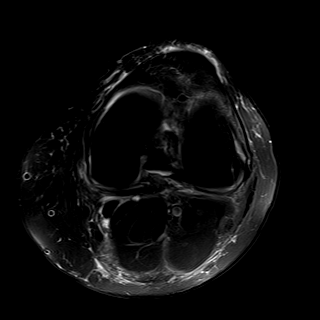
[im 24/24]
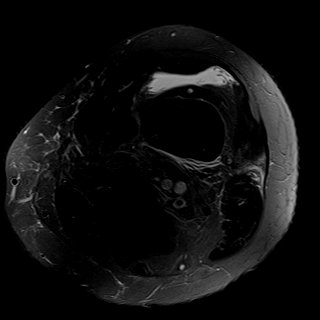

[Series 5: T2 fat-sat · coronal · 4.0mm · 0.31mm/px · 3 of 28 slices shown (2 of 2)]
[im 5/28]
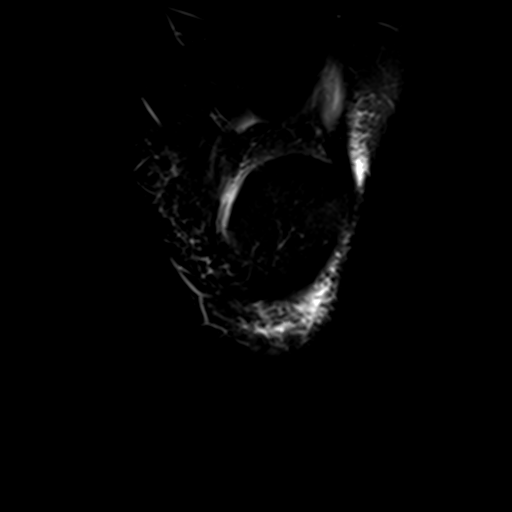
[im 14/28]
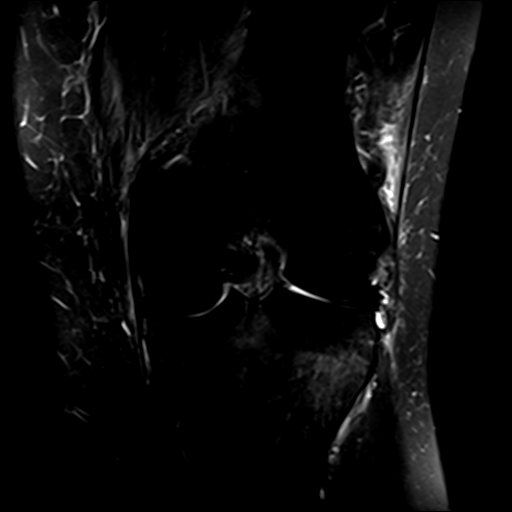
[im 23/28]
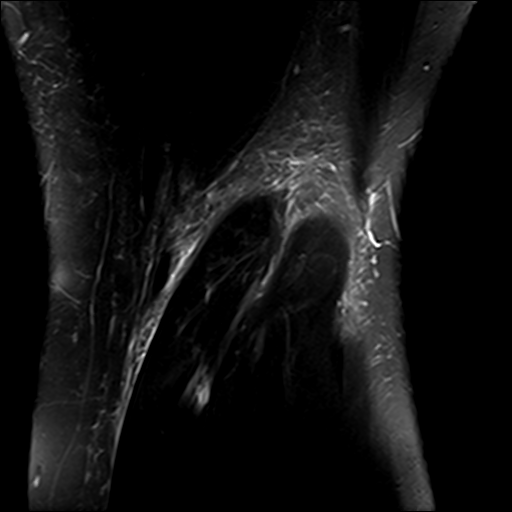

[Series 6: PD fat-sat · coronal · 3.0mm · 0.31mm/px · 7 of 32 slices shown (1 of 2)]
[im 1/32]
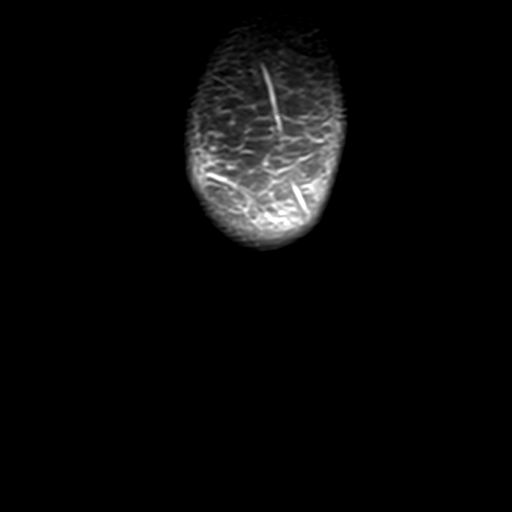
[im 6/32]
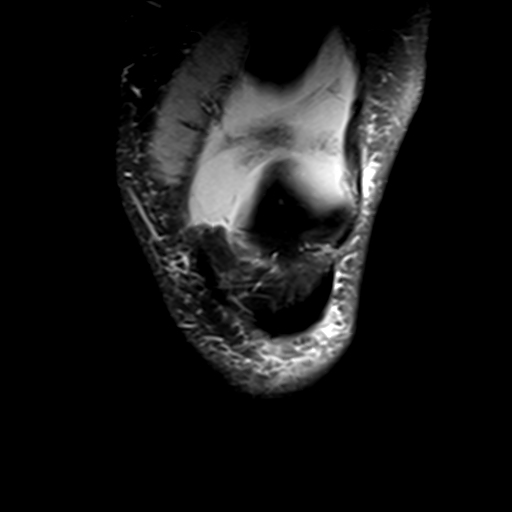
[im 11/32]
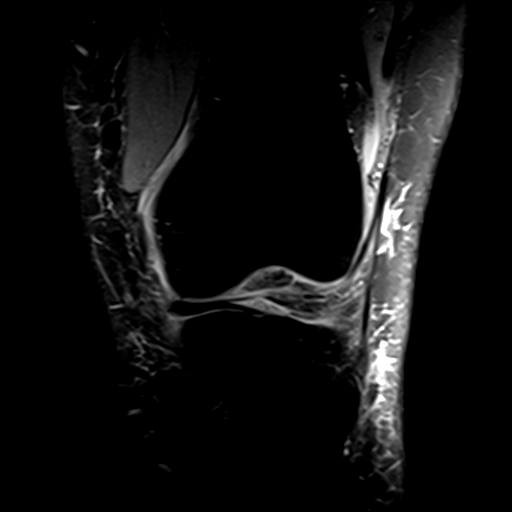
[im 16/32]
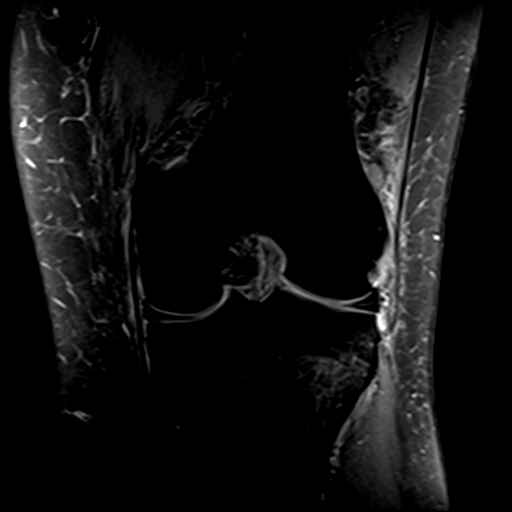
[im 21/32]
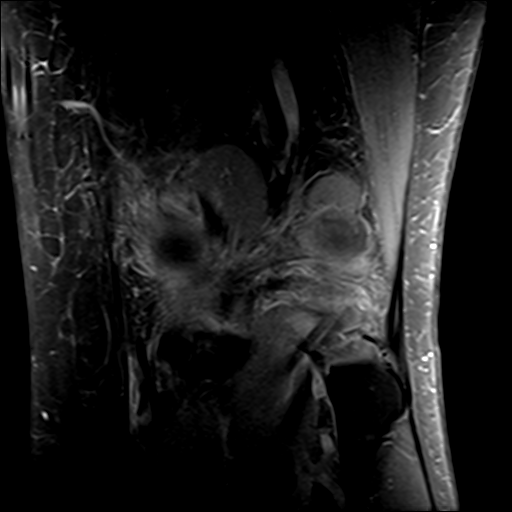
[im 26/32]
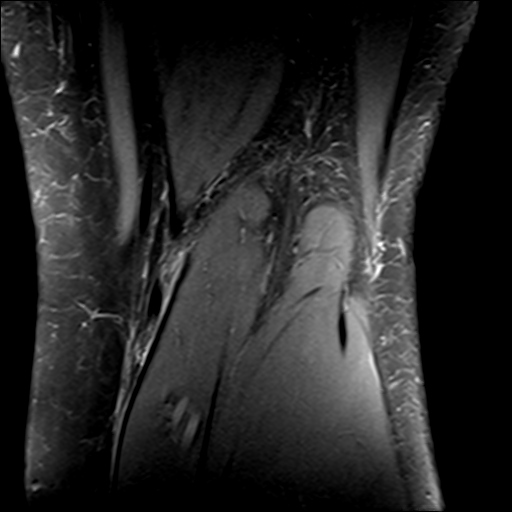
[im 32/32]
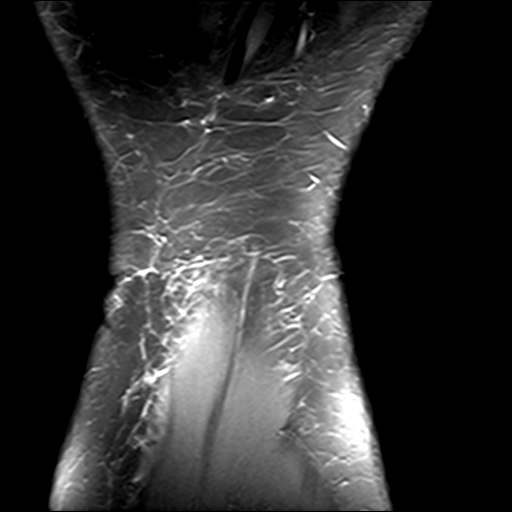

[Series 8: PD fat-sat · sagittal · 3.0mm · 0.31mm/px · 7 of 30 slices shown (2 of 2)]
[im 1/30]
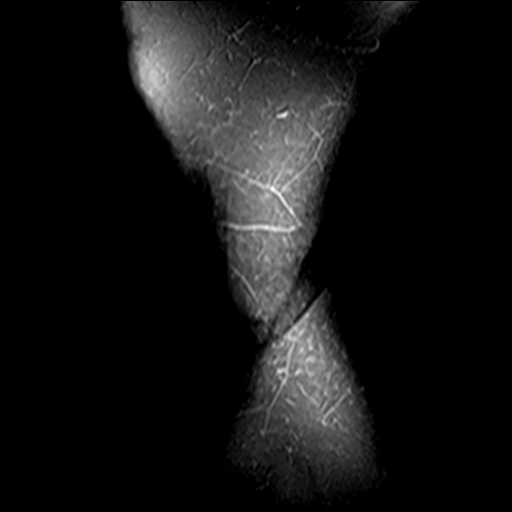
[im 5/30]
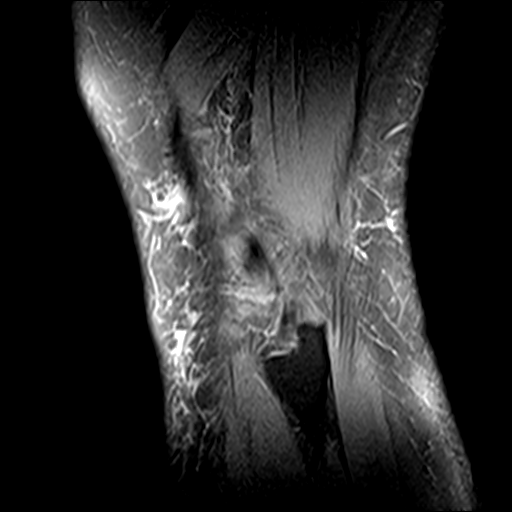
[im 10/30]
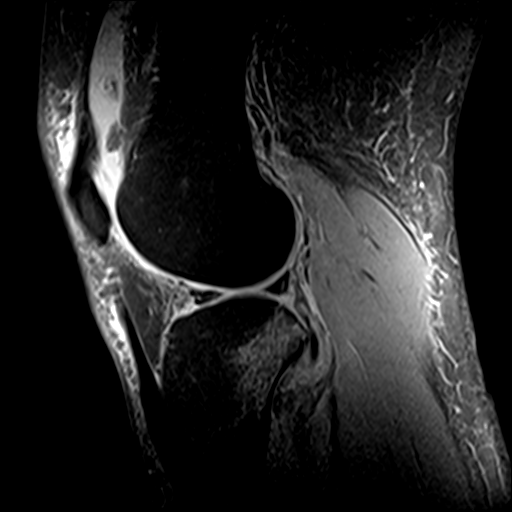
[im 15/30]
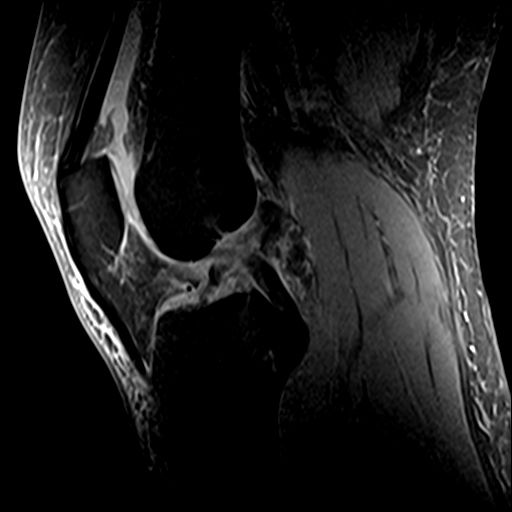
[im 20/30]
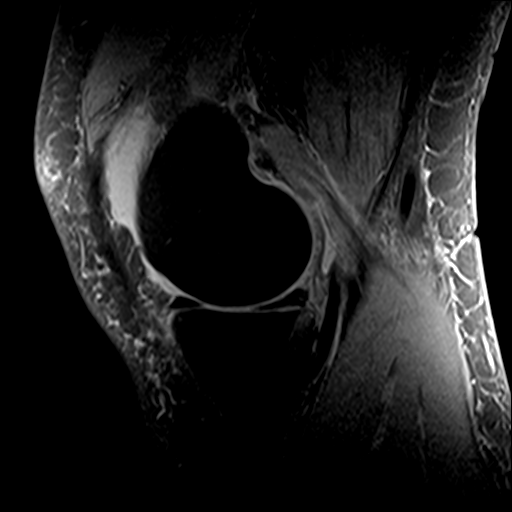
[im 25/30]
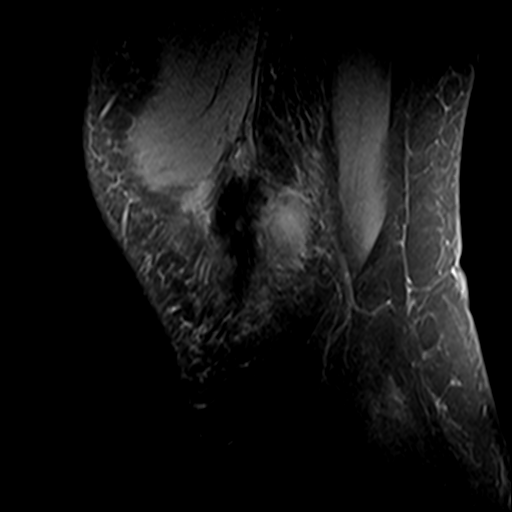
[im 30/30]
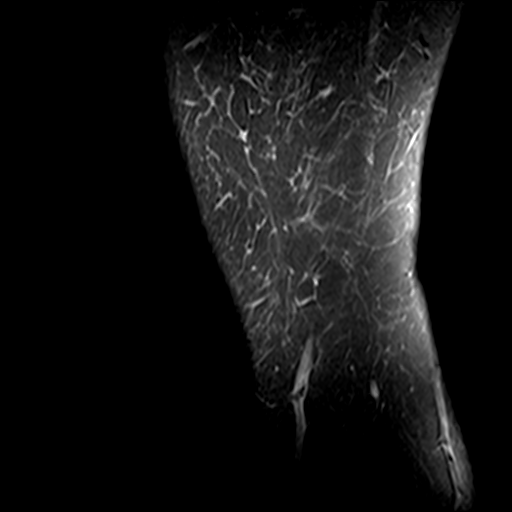

[21 of 40 positions shown; findings below may reference images not displayed]

FINDINGS: MENISCI

Medial meniscus: Horizontal tear in the posterior horn reaches the
meniscal undersurface.

Lateral meniscus: Intrasubstance increased T2 signal is seen in the
posterior horn and body but no signal reaching an articular surface
is identified.

LIGAMENTS

Cruciates: Abnormal intrasubstance increased T2 signal is seen in
the anterior cruciate ligament at its attachment to the lateral
aspect of the intercondylar notch of the femur consistent with
sprain. Partial tear of the ligament from the intercondylar notch
appears high-grade but there do appear to be intact fibers present.
The PCL is normal.

Collaterals:  Intact.

CARTILAGE

Patellofemoral:  Normal.

Medial:  Normal.

Lateral:  Normal.

Joint:  Small effusion.

Popliteal Fossa:  Small Baker's cyst.

Extensor Mechanism:  Intact.

Bones: The patient has a nondisplaced fracture of the posterior lip
of the lateral tibial plateau. Very small bone contusion is seen in
the posterior aspect of the medial tibial plateau.

Other: None.
IMPRESSION: Horizontal tear posterior horn medial meniscus reaches the meniscal
undersurface.

Sprain of the anterior cruciate ligament and partial tear of the
ligament from the intercondylar notch of the lateral femur. The tear
appears high-grade although some intact fibers are seen.

Small nondisplaced fracture of the posterior lip of the lateral
tibial plateau. Very small contusion in the posterior aspect of the
medial tibial plateau also noted.

Small joint effusion and small Baker's cyst.

## 2021-05-16 ENCOUNTER — Other Ambulatory Visit: Payer: Self-pay

## 2021-05-16 ENCOUNTER — Ambulatory Visit: Payer: BC Managed Care – PPO | Admitting: Nurse Practitioner

## 2021-05-16 ENCOUNTER — Encounter: Payer: Self-pay | Admitting: Nurse Practitioner

## 2021-05-16 VITALS — BP 136/87 | HR 69 | Temp 98.1°F | Ht 64.0 in | Wt 214.8 lb

## 2021-05-16 DIAGNOSIS — J3089 Other allergic rhinitis: Secondary | ICD-10-CM

## 2021-05-16 DIAGNOSIS — J4521 Mild intermittent asthma with (acute) exacerbation: Secondary | ICD-10-CM

## 2021-05-16 DIAGNOSIS — T7840XA Allergy, unspecified, initial encounter: Secondary | ICD-10-CM | POA: Insufficient documentation

## 2021-05-16 DIAGNOSIS — J4520 Mild intermittent asthma, uncomplicated: Secondary | ICD-10-CM | POA: Diagnosis not present

## 2021-05-16 MED ORDER — ALBUTEROL SULFATE HFA 108 (90 BASE) MCG/ACT IN AERS: 2.0000 | INHALATION_SPRAY | Freq: Four times a day (QID) | RESPIRATORY_TRACT | 2 refills | Status: DC | PRN

## 2021-05-16 MED ORDER — MONTELUKAST SODIUM 10 MG PO TABS: 10.0000 mg | ORAL_TABLET | ORAL | 3 refills | Status: DC

## 2021-05-16 MED ORDER — EPINEPHRINE 0.3 MG/0.3ML IJ SOAJ: 0.3000 mg | INTRAMUSCULAR | 1 refills | Status: AC | PRN

## 2021-05-16 MED ORDER — METHYLPREDNISOLONE 4 MG PO TBPK: ORAL_TABLET | ORAL | 0 refills | Status: DC

## 2021-05-16 NOTE — Progress Notes (Signed)
Established patient visit   Patient: Paige Alvarez   DOB: July 20, 1965   56 y.o. Female  MRN: [id number] Visit Date: 05/16/2021  No chief complaint on file.  Subjective    Asthma She complains of chest tightness, cough, difficulty breathing and shortness of breath. There is no wheezing. This is a recurrent problem. The current episode started in the past 7 days. The cough is productive of sputum and harsh. Associated symptoms include chest pain, dyspnea on exertion, headaches, malaise/fatigue, postnasal drip, rhinorrhea, sneezing and a sore throat. Pertinent negatives include no appetite change, fever or myalgias. Her symptoms are aggravated by animal exposure, emotional stress and pollen. Her symptoms are alleviated by OTC inhaler and OTC cough suppressant. She reports moderate improvement on treatment. Risk factors for lung disease include animal exposure. Her past medical history is significant for asthma.     Medications: Outpatient Medications Prior to Visit  Medication Sig   fexofenadine (ALLEGRA) 180 MG tablet Take 180 mg by mouth daily.    amLODipine (NORVASC) 5 MG tablet Take 5 mg by mouth every morning. (Patient not taking: Reported on 05/16/2021)   [DISCONTINUED] montelukast (SINGULAIR) 10 MG tablet Take 10 mg by mouth every morning. (Patient not taking: Reported on 05/16/2021)   No facility-administered medications prior to visit.    Review of Systems  Constitutional:  Positive for fatigue and malaise/fatigue. Negative for activity change, appetite change, chills and fever.  HENT:  Positive for congestion, postnasal drip, rhinorrhea, sinus pressure, sneezing and sore throat. Negative for sinus pain.   Eyes: Negative.   Respiratory:  Positive for cough and shortness of breath. Negative for chest tightness and wheezing.   Cardiovascular:  Positive for chest pain and dyspnea on exertion. Negative for palpitations.  Gastrointestinal:  Negative for abdominal pain, constipation,  diarrhea, nausea and vomiting.  Endocrine: Negative for cold intolerance, heat intolerance, polydipsia and polyuria.  Genitourinary:  Negative for dyspareunia, dysuria, flank pain, frequency and urgency.  Musculoskeletal:  Negative for arthralgias, back pain and myalgias.  Skin:  Negative for rash.  Allergic/Immunologic: Negative for environmental allergies.  Neurological:  Positive for headaches. Negative for dizziness and weakness.  Hematological:  Negative for adenopathy.  Psychiatric/Behavioral:  The patient is not nervous/anxious.     Objective     Today's Vitals   05/16/21 1003  BP: 136/87  Pulse: 69  Temp: 98.1 F (36.7 C)  SpO2: 96%  Weight: 214 lb 12.8 oz (97.4 kg)  Height: 5\' 4"  (1.626 m)   Body mass index is 36.87 kg/m.   Physical Exam Vitals and nursing note reviewed.  Constitutional:      Appearance: Normal appearance. She is well-developed.  HENT:     Head: Normocephalic and atraumatic.     Right Ear: Hearing, tympanic membrane, ear canal and external ear normal.     Left Ear: Hearing, ear canal and external ear normal. Tympanic membrane is bulging.     Nose: Congestion present.     Right Sinus: Frontal sinus tenderness present.     Left Sinus: Frontal sinus tenderness present.     Mouth/Throat:     Pharynx: Posterior oropharyngeal erythema present.  Eyes:     Pupils: Pupils are equal, round, and reactive to light.  Cardiovascular:     Rate and Rhythm: Normal rate and regular rhythm.     Pulses: Normal pulses.     Heart sounds: Normal heart sounds.  Pulmonary:     Effort: Pulmonary effort is normal.     Breath  sounds: Normal breath sounds.     Comments: Mild, nonproductive cough noted during todays visit  Abdominal:     Palpations: Abdomen is soft.  Musculoskeletal:        General: Normal range of motion.     Cervical back: Normal range of motion and neck supple.  Lymphadenopathy:     Cervical: No cervical adenopathy.  Skin:    General: Skin is  warm and dry.     Capillary Refill: Capillary refill takes less than 2 seconds.  Neurological:     General: No focal deficit present.     Mental Status: She is alert and oriented to person, place, and time.  Psychiatric:        Mood and Affect: Mood normal.        Behavior: Behavior normal.        Thought Content: Thought content normal.        Judgment: Judgment normal.      Assessment & Plan     1. Mild intermittent asthma with exacerbation Start medrol taper. Take as directed ro 6 days. Add rescue inhaler. Take 1 to 2 inhalations up to 4 times daily as needed for wheezing and cough.  - methylPREDNISolone (MEDROL) 4 MG TBPK tablet; Take by mouth as directed for 6 days  Dispense: 21 tablet; Refill: 0  2. Mild intermittent asthma without complication Add rescue inhaler. Take 1 to 2 inhalations up to 4 times daily as needed for wheezing and cough.  - albuterol (VENTOLIN HFA) 108 (90 Base) MCG/ACT inhaler; Inhale 2 puffs into the lungs every 6 (six) hours as needed for wheezing or shortness of breath.  Dispense: 1 each; Refill: 2  3. Environmental and seasonal allergies Restart singulair 10mg  daily. Continue with allegra every day - montelukast (SINGULAIR) 10 MG tablet; Take 1 tablet (10 mg total) by mouth every morning.  Dispense: 30 tablet; Refill: 3  4. Allergic reaction, initial encounter New rx for epi pen sent to pharmacy. Use if needed for anaphylactic reaction. Advised her to seek emergency care anytime she uses the epi pen. She voiced understanding.  - EPINEPHrine (EPIPEN 2-PAK) 0.3 mg/0.3 mL IJ SOAJ injection; Inject 0.3 mg into the muscle as needed for anaphylaxis.  Dispense: 1 each; Refill: 1   Return in about 4 months (around 09/13/2021) for health maintenance exam, FBW a week prior to visit.        09/15/2021, NP  [facility] [phone] (phone) [phone] (fax)  [facility]

## 2021-06-03 DIAGNOSIS — Z1231 Encounter for screening mammogram for malignant neoplasm of breast: Secondary | ICD-10-CM | POA: Diagnosis not present

## 2021-06-03 LAB — HM MAMMOGRAPHY

## 2021-06-05 ENCOUNTER — Encounter: Payer: Self-pay | Admitting: Physician Assistant

## 2021-08-22 ENCOUNTER — Ambulatory Visit: Payer: BC Managed Care – PPO | Admitting: Orthopedic Surgery

## 2021-08-22 ENCOUNTER — Encounter: Payer: Self-pay | Admitting: Orthopedic Surgery

## 2021-08-22 ENCOUNTER — Ambulatory Visit (INDEPENDENT_AMBULATORY_CARE_PROVIDER_SITE_OTHER): Payer: BC Managed Care – PPO

## 2021-08-22 DIAGNOSIS — M79672 Pain in left foot: Secondary | ICD-10-CM

## 2021-08-22 DIAGNOSIS — M7662 Achilles tendinitis, left leg: Secondary | ICD-10-CM | POA: Diagnosis not present

## 2021-08-22 NOTE — Progress Notes (Unsigned)
Office Visit Note   Patient: Paige Alvarez           Date of Birth: [birth year]           MRN: [id number] Visit Date: 08/22/2021              Requested by: Mayer Masker, PA-C [address] [place],  [place] [address] PCP: Mayer Masker, PA-C  Chief Complaint  Patient presents with   Left Ankle - Pain   Left Heel - Pain      HPI: Patient is a 56 year old woman who presents complaining of a prolonged problem with insertional Achilles tendinitis on the left.  Patient states she has been doing stretching topical Voltaren gel oral Naprosyn without relief.  Patient states that she has previously had good relief of the planter fasciitis on the right foot with the gastrocnemius recession.  Assessment & Plan: Visit Diagnoses:  1. Pain in left foot   2. Achilles tendinitis, left leg     Plan: Due to failure of conservative treatment patient would like to proceed with a gastrocnemius recession on the left.  We will set this up as an outpatient.  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  Follow-Up Instructions: Return in about 2 weeks (around 09/05/2021).   Ortho Exam  Patient is alert, oriented, no adenopathy, well-dressed, normal affect, normal respiratory effort. Examination patient is a good dorsalis pedis pulse she has dorsiflexion to neutral.  She is point tender to palpation at the insertion of the left Achilles.  She has no tenderness or nodules along the course of the Achilles.  Imaging: XR Foot Complete Left  Result Date: 08/22/2021 Three-view radiographs of the left foot shows an insertional tendinitis of the Achilles.  No images are attached to the encounter.  Labs: Lab Results  Component Value Date   HGBA1C 5.8 (H) 02/06/2020   LABURIC 4.8 07/21/2017     Lab Results  Component Value Date   ALBUMIN 4.7 02/06/2020   ALBUMIN 4.2 04/18/2018   ALBUMIN  4.2 07/27/2017    Lab Results  Component Value Date   MG 1.9 05/01/2011   No results found for: VD25OH  No results found for: PREALBUMIN    Latest Ref Rng & Units 02/06/2020    9:00 AM 09/16/2018    9:17 AM 04/18/2018    5:12 PM  CBC EXTENDED  WBC 3.4 - 10.8 x10E3/uL 5.8   6.7   6.7    RBC 3.77 - 5.28 x10E6/uL 5.05   4.68   4.90    Hemoglobin 11.1 - 15.9 g/dL 43.3   29.5   18.8    HCT 34.0 - 46.6 % 44.3   41.6   43.5    Platelets 150 - 450 x10E3/uL 281   290   285    NEUT# 1.4 - 7.0 x10E3/uL 2.9    3.5    Lymph# 0.7 - 3.1 x10E3/uL 2.1    2.4       There is no height or weight on file to calculate BMI.  Orders:  Orders Placed This Encounter  Procedures   XR Foot Complete Left   No orders of the defined types were placed in this encounter.    Procedures: No procedures performed  Clinical Data: No additional findings.  ROS:  All other systems negative, except as noted in the HPI. Review of Systems  Objective:  Vital Signs: There were no vitals taken for this visit.  Specialty Comments:  No specialty comments available.  PMFS History: Patient Active Problem List   Diagnosis Date Noted   Mild intermittent asthma with exacerbation 05/16/2021   Mild intermittent asthma without complication 05/16/2021   Environmental and seasonal allergies 05/16/2021   Allergic reaction 05/16/2021   Hypertension 09/27/2019   Insomnia 09/27/2019   Achilles tendon contracture, right 06/01/2017   Dysuria 07/22/2015   Microscopic hematuria 07/22/2015   GERD (gastroesophageal reflux disease) 07/17/2015   Anxiety 07/17/2015   Depression 07/17/2015   Hypercholesteremia 07/17/2015   IBS (irritable bowel syndrome) 07/17/2015   Past Medical History:  Diagnosis Date   Allergic rhinitis    Anemia    iron deficiency anemia    Anxiety    Asthma    allergy related    Depression    Phreesia 09/25/2019   Diverticulosis    Family history of adverse reaction to anesthesia    pt  stated that her mother had PONV   GERD (gastroesophageal reflux disease)    H/O hiatal hernia    repair with weight loss surgery    Headache    Hyperlipidemia    Phreesia 09/25/2019   Hypertension    IBS (irritable bowel syndrome)    PONV (postoperative nausea and vomiting)    RLS (restless legs syndrome)    Tears of meniscus and ACL of left knee    Wears glasses    Wears glasses     Family History  Problem Relation Age of Onset   Cancer Father        prostate   COPD Father    Liver disease Mother    Asthma Mother    Depression Mother    Diabetes Mother    Hyperlipidemia Mother    Breast cancer Paternal Aunt    Hepatitis C Paternal Grandmother    Colon cancer Paternal Uncle    Cancer Maternal Grandfather     Past Surgical History:  Procedure Laterality Date   achilles lenghtening     ANTERIOR CRUCIATE LIGAMENT REPAIR Left 09/16/2018   Procedure: LEFT KNEE RECONSTRUCTION ANTERIOR CRUCIATE LIGAMENT (ACL) WITH HAMSTRING AUTOGRAFT MENISCAL REPAIR VS DEBRIDEMENT;  Surgeon: Cammy Copa, MD;  Location: [facility] OR;  Service: Orthopedics;  Laterality: Left;   CHOLECYSTECTOMY N/A 01/12/2014   Procedure: LAPAROSCOPIC CHOLECYSTECTOMY;  Surgeon: Glenna Fellows, MD;  Location: [facility];  Service: General;  Laterality: N/A;   COLONOSCOPY WITH ESOPHAGOGASTRODUODENOSCOPY (EGD)     gastric sleeve wtih hiatal hernia repair      TONSILLECTOMY     uterine ablaton      Social History   Occupational History   Not on file  Tobacco Use   Smoking status: Never   Smokeless tobacco: Never  Vaping Use   Vaping Use: Never used  Substance and Sexual Activity   Alcohol use: Yes    Comment: rare   Drug use: No   Sexual activity: Yes    Birth control/protection: Surgical

## 2021-09-05 ENCOUNTER — Other Ambulatory Visit: Payer: BC Managed Care – PPO

## 2021-09-09 ENCOUNTER — Other Ambulatory Visit: Payer: Self-pay | Admitting: Nurse Practitioner

## 2021-09-09 DIAGNOSIS — J3089 Other allergic rhinitis: Secondary | ICD-10-CM

## 2021-09-11 ENCOUNTER — Other Ambulatory Visit: Payer: Self-pay

## 2021-09-11 ENCOUNTER — Encounter (HOSPITAL_COMMUNITY): Payer: Self-pay | Admitting: Orthopedic Surgery

## 2021-09-11 NOTE — Progress Notes (Signed)
PCP - Dr. Chrisandra Carota  Cardiologist - Denies  EP-  Denies  Endocrine-  Denies  Pulm-  Denies  Chest x-ray -  Denies  EKG - 09/13/21- Day of surgery  Stress Test -  Denies  ECHO -  Denies  Cardiac Cath -  Denies  AICD- na PM-na LOOP-na  Nerve Stimulator-  Denies  Dialysis-  Denies  Sleep Study -  Denies CPAP -  Denies  LABS- 09/13/21: CBC, BMP  ASA-  Denies  ERAS- Yes- clears until 0640  HA1C-  Denies  Anesthesia- No  Pt denies having chest pain, sob, or fever during the pre-op phone call. All instructions explained to the pt, with a verbal understanding of the material including: as of today,  stop taking all Aspirin (unless instructed by your doctor) and Other Aspirin containing products, Vitamins, Fish oils, and Herbal medications. Also stop all NSAIDS i.e. Advil, Ibuprofen, Motrin, Aleve, Anaprox, Naproxen, BC, Goody Powders, and all Supplements. Pt also instructed to wear a mask and social distance if she goes out. The opportunity to ask questions was provided.

## 2021-09-12 ENCOUNTER — Encounter: Payer: BC Managed Care – PPO | Admitting: Nurse Practitioner

## 2021-09-13 ENCOUNTER — Ambulatory Visit (HOSPITAL_COMMUNITY)
Admission: RE | Admit: 2021-09-13 | Discharge: 2021-09-13 | Disposition: A | Discharge status: Home / Self Care | Payer: BC Managed Care – PPO | Attending: Orthopedic Surgery | Admitting: Orthopedic Surgery

## 2021-09-13 ENCOUNTER — Ambulatory Visit (HOSPITAL_COMMUNITY): Payer: BC Managed Care – PPO | Admitting: Anesthesiology

## 2021-09-13 ENCOUNTER — Encounter (HOSPITAL_COMMUNITY)
Admission: RE | Disposition: A | Discharge status: Home / Self Care | Payer: Self-pay | Source: Home / Self Care | Attending: Orthopedic Surgery

## 2021-09-13 ENCOUNTER — Other Ambulatory Visit: Payer: Self-pay

## 2021-09-13 ENCOUNTER — Encounter (HOSPITAL_COMMUNITY): Payer: Self-pay | Admitting: Orthopedic Surgery

## 2021-09-13 DIAGNOSIS — K449 Diaphragmatic hernia without obstruction or gangrene: Secondary | ICD-10-CM | POA: Insufficient documentation

## 2021-09-13 DIAGNOSIS — M6702 Short Achilles tendon (acquired), left ankle: Secondary | ICD-10-CM

## 2021-09-13 DIAGNOSIS — M7662 Achilles tendinitis, left leg: Secondary | ICD-10-CM | POA: Diagnosis not present

## 2021-09-13 DIAGNOSIS — K219 Gastro-esophageal reflux disease without esophagitis: Secondary | ICD-10-CM | POA: Diagnosis not present

## 2021-09-13 DIAGNOSIS — I10 Essential (primary) hypertension: Secondary | ICD-10-CM | POA: Insufficient documentation

## 2021-09-13 DIAGNOSIS — F419 Anxiety disorder, unspecified: Secondary | ICD-10-CM | POA: Diagnosis not present

## 2021-09-13 DIAGNOSIS — J45909 Unspecified asthma, uncomplicated: Secondary | ICD-10-CM | POA: Diagnosis not present

## 2021-09-13 DIAGNOSIS — R519 Headache, unspecified: Secondary | ICD-10-CM | POA: Insufficient documentation

## 2021-09-13 DIAGNOSIS — F32A Depression, unspecified: Secondary | ICD-10-CM | POA: Diagnosis not present

## 2021-09-13 HISTORY — PX: GASTROCNEMIUS RECESSION: SHX863

## 2021-09-13 LAB — CBC
HCT: 42.3 % (ref 36.0–46.0)
Hemoglobin: 14.4 g/dL (ref 12.0–15.0)
MCH: 30 pg (ref 26.0–34.0)
MCHC: 34 g/dL (ref 30.0–36.0)
MCV: 88.1 fL (ref 80.0–100.0)
Platelets: 274 10*3/uL (ref 150–400)
RBC: 4.8 MIL/uL (ref 3.87–5.11)
RDW: 12.9 % (ref 11.5–15.5)
WBC: 7.7 10*3/uL (ref 4.0–10.5)
nRBC: 0 % (ref 0.0–0.2)

## 2021-09-13 LAB — BASIC METABOLIC PANEL
Anion gap: 12 (ref 5–15)
BUN: 12 mg/dL (ref 6–20)
CO2: 23 mmol/L (ref 22–32)
Calcium: 9.2 mg/dL (ref 8.9–10.3)
Chloride: 103 mmol/L (ref 98–111)
Creatinine, Ser: 0.63 mg/dL (ref 0.44–1.00)
GFR, Estimated: >60 mL/min (ref >60)
Glucose, Bld: 104 mg/dL — ABNORMAL HIGH (ref 70–99)
Potassium: 3.3 mmol/L — ABNORMAL LOW (ref 3.5–5.1)
Sodium: 138 mmol/L (ref 135–145)

## 2021-09-13 SURGERY — RECESSION, MUSCLE, GASTROCNEMIUS
Anesthesia: General | Laterality: Left

## 2021-09-13 MED ORDER — DIPHENHYDRAMINE HCL 50 MG/ML IJ SOLN
INTRAMUSCULAR | Status: AC
  Filled 2021-09-13: qty 1

## 2021-09-13 MED ORDER — MIDAZOLAM HCL 2 MG/2ML IJ SOLN
INTRAMUSCULAR | Status: AC
  Filled 2021-09-13: qty 2

## 2021-09-13 MED ORDER — CHLORHEXIDINE GLUCONATE 0.12 % MT SOLN
15.0000 mL | Freq: Once | OROMUCOSAL | Status: DC
  Filled 2021-09-13: qty 15

## 2021-09-13 MED ORDER — LIDOCAINE HCL (PF) 1 % IJ SOLN
INTRAMUSCULAR | Status: DC | PRN
  Administered 2021-09-13: 10 mL

## 2021-09-13 MED ORDER — FENTANYL CITRATE (PF) 250 MCG/5ML IJ SOLN
INTRAMUSCULAR | Status: AC
  Filled 2021-09-13: qty 5

## 2021-09-13 MED ORDER — ONDANSETRON HCL 4 MG/2ML IJ SOLN
INTRAMUSCULAR | Status: AC
  Filled 2021-09-13: qty 2

## 2021-09-13 MED ORDER — 0.9 % SODIUM CHLORIDE (POUR BTL) OPTIME
TOPICAL | Status: DC | PRN
  Administered 2021-09-13: 1000 mL

## 2021-09-13 MED ORDER — MIDAZOLAM HCL 5 MG/5ML IJ SOLN
INTRAMUSCULAR | Status: DC | PRN
  Administered 2021-09-13: 2 mg via INTRAVENOUS

## 2021-09-13 MED ORDER — PROPOFOL 10 MG/ML IV BOLUS
INTRAVENOUS | Status: AC
  Filled 2021-09-13: qty 20

## 2021-09-13 MED ORDER — BUPIVACAINE HCL (PF) 0.25 % IJ SOLN
INTRAMUSCULAR | Status: AC
  Filled 2021-09-13: qty 30

## 2021-09-13 MED ORDER — PROPOFOL 10 MG/ML IV BOLUS
INTRAVENOUS | Status: DC | PRN
  Administered 2021-09-13: 200 mg via INTRAVENOUS

## 2021-09-13 MED ORDER — KETOROLAC TROMETHAMINE 30 MG/ML IJ SOLN
INTRAMUSCULAR | Status: AC
  Filled 2021-09-13: qty 1

## 2021-09-13 MED ORDER — FENTANYL CITRATE (PF) 100 MCG/2ML IJ SOLN
INTRAMUSCULAR | Status: DC | PRN
Start: 2021-09-13 — End: 2021-09-13
  Administered 2021-09-13 (×3): 50 ug via INTRAVENOUS

## 2021-09-13 MED ORDER — LIDOCAINE HCL (PF) 1 % IJ SOLN
INTRAMUSCULAR | Status: AC
  Filled 2021-09-13: qty 30

## 2021-09-13 MED ORDER — CEFAZOLIN SODIUM-DEXTROSE 2-4 GM/100ML-% IV SOLN
2.0000 g | INTRAVENOUS | Status: AC
  Administered 2021-09-13: 2 g via INTRAVENOUS
  Filled 2021-09-13: qty 100

## 2021-09-13 MED ORDER — LIDOCAINE 2% (20 MG/ML) 5 ML SYRINGE
INTRAMUSCULAR | Status: AC
  Filled 2021-09-13: qty 5

## 2021-09-13 MED ORDER — KETOROLAC TROMETHAMINE 30 MG/ML IJ SOLN
INTRAMUSCULAR | Status: DC | PRN
  Administered 2021-09-13: 30 mg via INTRAVENOUS

## 2021-09-13 MED ORDER — ONDANSETRON HCL 4 MG/2ML IJ SOLN
INTRAMUSCULAR | Status: DC | PRN
  Administered 2021-09-13: 4 mg via INTRAVENOUS

## 2021-09-13 MED ORDER — ONDANSETRON HCL 4 MG/2ML IJ SOLN: 4.0000 mg | Freq: Four times a day (QID) | INTRAMUSCULAR | Status: DC | PRN

## 2021-09-13 MED ORDER — DIPHENHYDRAMINE HCL 50 MG/ML IJ SOLN
INTRAMUSCULAR | Status: DC | PRN
  Administered 2021-09-13: 6.25 mg via INTRAVENOUS

## 2021-09-13 MED ORDER — LIDOCAINE 2% (20 MG/ML) 5 ML SYRINGE
INTRAMUSCULAR | Status: DC | PRN
  Administered 2021-09-13: 60 mg via INTRAVENOUS

## 2021-09-13 MED ORDER — LACTATED RINGERS IV SOLN: INTRAVENOUS | Status: DC

## 2021-09-13 MED ORDER — LACTATED RINGERS IV SOLN: INTRAVENOUS | Status: DC | PRN

## 2021-09-13 MED ORDER — ORAL CARE MOUTH RINSE: 15.0000 mL | Freq: Once | OROMUCOSAL | Status: DC

## 2021-09-13 MED ORDER — HYDROCODONE-ACETAMINOPHEN 5-325 MG PO TABS: 1.0000 | ORAL_TABLET | ORAL | 0 refills | Status: DC | PRN

## 2021-09-13 MED ORDER — FENTANYL CITRATE (PF) 100 MCG/2ML IJ SOLN: 25.0000 ug | INTRAMUSCULAR | Status: DC | PRN

## 2021-09-13 SURGICAL SUPPLY — 34 items
BAG COUNTER SPONGE SURGICOUNT (BAG) ×3 IMPLANT
BAG SPNG CNTER NS LX DISP (BAG) ×1
BNDG CMPR 5X6 CHSV STRCH STRL (GAUZE/BANDAGES/DRESSINGS) ×1
BNDG COHESIVE 6X5 TAN ST LF (GAUZE/BANDAGES/DRESSINGS) ×1 IMPLANT
BNDG COHESIVE 6X5 TAN STRL LF (GAUZE/BANDAGES/DRESSINGS) ×2 IMPLANT
BNDG GAUZE DERMACEA FLUFF (GAUZE/BANDAGES/DRESSINGS) ×1
BNDG GAUZE DERMACEA FLUFF 4 (GAUZE/BANDAGES/DRESSINGS) IMPLANT
BNDG GZE DERMACEA 4 6PLY (GAUZE/BANDAGES/DRESSINGS) ×1
COVER MAYO STAND STRL (DRAPES) ×3 IMPLANT
COVER SURGICAL LIGHT HANDLE (MISCELLANEOUS) ×3 IMPLANT
DRAPE U-SHAPE 47X51 STRL (DRAPES) ×3 IMPLANT
DRSG EMULSION OIL 3X3 NADH (GAUZE/BANDAGES/DRESSINGS) ×3 IMPLANT
DRSG PAD ABDOMINAL 8X10 ST (GAUZE/BANDAGES/DRESSINGS) ×1 IMPLANT
DURAPREP 26ML APPLICATOR (WOUND CARE) ×3 IMPLANT
ELECT REM PT RETURN 9FT ADLT (ELECTROSURGICAL) ×2
ELECTRODE REM PT RTRN 9FT ADLT (ELECTROSURGICAL) ×2 IMPLANT
GAUZE SPONGE 4X4 12PLY STRL (GAUZE/BANDAGES/DRESSINGS) ×3 IMPLANT
GLOVE BIO SURGEON STRL SZ7 (GLOVE) ×1 IMPLANT
GLOVE BIO SURGEON STRL SZ7.5 (GLOVE) ×1 IMPLANT
GLOVE BIOGEL PI IND STRL 9 (GLOVE) ×2 IMPLANT
GLOVE BIOGEL PI INDICATOR 9 (GLOVE) ×1
GLOVE SURG ORTHO 9.0 STRL STRW (GLOVE) ×3 IMPLANT
GOWN STRL REUS W/ TWL XL LVL3 (GOWN DISPOSABLE) ×4 IMPLANT
GOWN STRL REUS W/TWL XL LVL3 (GOWN DISPOSABLE) ×4
KIT BASIN OR (CUSTOM PROCEDURE TRAY) ×3 IMPLANT
KIT TURNOVER KIT B (KITS) ×3 IMPLANT
PACK ORTHO EXTREMITY (CUSTOM PROCEDURE TRAY) ×3 IMPLANT
PAD ARMBOARD 7.5X6 YLW CONV (MISCELLANEOUS) ×6 IMPLANT
PADDING CAST ABS 4INX4YD NS (CAST SUPPLIES)
PADDING CAST ABS COTTON 4X4 ST (CAST SUPPLIES) ×2 IMPLANT
SPONGE T-LAP 18X18 ~~LOC~~+RFID (SPONGE) ×3 IMPLANT
SUT ETHILON 2 0 PSLX (SUTURE) ×4 IMPLANT
UNDERPAD 30X36 HEAVY ABSORB (UNDERPADS AND DIAPERS) ×3 IMPLANT
WATER STERILE IRR 1000ML POUR (IV SOLUTION) ×3 IMPLANT

## 2021-09-13 NOTE — Anesthesia Preprocedure Evaluation (Signed)
Anesthesia Evaluation  Patient identified by MRN, date of birth, ID band Patient awake    Reviewed: Allergy & Precautions, H&P , NPO status , Patient's Chart, lab work & pertinent test results  History of Anesthesia Complications (+) PONV and history of anesthetic complications  Airway Mallampati: II   Neck ROM: full    Dental   Pulmonary asthma ,    breath sounds clear to auscultation       Cardiovascular hypertension,  Rhythm:regular Rate:Normal     Neuro/Psych  Headaches, PSYCHIATRIC DISORDERS Anxiety Depression    GI/Hepatic hiatal hernia, GERD  ,  Endo/Other    Renal/GU      Musculoskeletal   Abdominal   Peds  Hematology   Anesthesia Other Findings   Reproductive/Obstetrics                             Anesthesia Physical Anesthesia Plan  ASA: 2  Anesthesia Plan: General   Post-op Pain Management: Regional block*   Induction: Intravenous  PONV Risk Score and Plan: 4 or greater and Ondansetron, Dexamethasone, Midazolam and Treatment may vary due to age or medical condition  Airway Management Planned: Oral ETT  Additional Equipment:   Intra-op Plan:   Post-operative Plan: Extubation in OR  Informed Consent: I have reviewed the patients History and Physical, chart, labs and discussed the procedure including the risks, benefits and alternatives for the proposed anesthesia with the patient or authorized representative who has indicated his/her understanding and acceptance.     Dental advisory given  Plan Discussed with: CRNA, Anesthesiologist and Surgeon  Anesthesia Plan Comments:         Anesthesia Quick Evaluation

## 2021-09-13 NOTE — Anesthesia Procedure Notes (Signed)
Procedure Name: LMA Insertion Date/Time: 09/13/2021 7:27 AM  Performed by: Marny Lowenstein, CRNAPre-anesthesia Checklist: Patient identified, Emergency Drugs available, Suction available and Patient being monitored Patient Re-evaluated:Patient Re-evaluated prior to induction Oxygen Delivery Method: Circle system utilized Preoxygenation: Pre-oxygenation with 100% oxygen Induction Type: IV induction Ventilation: Mask ventilation without difficulty LMA: LMA inserted LMA Size: 4.0 Number of attempts: 1 Placement Confirmation: positive ETCO2 and breath sounds checked- equal and bilateral Tube secured with: Tape Dental Injury: Teeth and Oropharynx as per pre-operative assessment

## 2021-09-16 ENCOUNTER — Encounter (HOSPITAL_COMMUNITY): Payer: Self-pay | Admitting: Orthopedic Surgery

## 2021-09-19 ENCOUNTER — Ambulatory Visit (INDEPENDENT_AMBULATORY_CARE_PROVIDER_SITE_OTHER): Payer: BC Managed Care – PPO | Admitting: Orthopedic Surgery

## 2021-09-19 DIAGNOSIS — M7662 Achilles tendinitis, left leg: Secondary | ICD-10-CM

## 2021-09-20 ENCOUNTER — Encounter: Payer: Self-pay | Admitting: Orthopedic Surgery

## 2021-09-20 NOTE — Progress Notes (Signed)
Office Visit Note   Patient: Paige Alvarez           Date of Birth: [birth year]           MRN: [id number] Visit Date: 09/19/2021              Requested by: Mayer Masker, PA-C [address] [place],  [place] [address] PCP: Mayer Masker, PA-C  Chief Complaint  Patient presents with   Left Foot - Routine Post Op    09/13/21 left gastroc recession      HPI: Patient is a 56 year old woman who is 1 week status post left gastrocnemius recession for Achilles tendinitis and contracture.  Patient states she is doing well has had a lot of improvement with her foot.  Assessment & Plan: Visit Diagnoses:  1. Achilles tendinitis, left leg     Plan: Recommended scar massage continue with activities as tolerated no restrictions follow-up in 1 week to harvest the sutures.  Follow-Up Instructions: Return in about 1 week (around 09/26/2021).   Ortho Exam  Patient is alert, oriented, no adenopathy, well-dressed, normal affect, normal respiratory effort. Examination the incision is healing nicely there is no redness no drainage.  Patient has good range of motion of her ankle ambulating in regular shoewear.  Imaging: No results found. No images are attached to the encounter.  Labs: Lab Results  Component Value Date   HGBA1C 5.8 (H) 02/06/2020   LABURIC 4.8 07/21/2017     Lab Results  Component Value Date   ALBUMIN 4.7 02/06/2020   ALBUMIN 4.2 04/18/2018   ALBUMIN 4.2 07/27/2017    Lab Results  Component Value Date   MG 1.9 05/01/2011   No results found for: "VD25OH"  No results found for: "PREALBUMIN"    Latest Ref Rng & Units 09/13/2021    6:10 AM 02/06/2020    9:00 AM 09/16/2018    9:17 AM  CBC EXTENDED  WBC 4.0 - 10.5 K/uL 7.7  5.8  6.7   RBC 3.87 - 5.11 MIL/uL 4.80  5.05  4.68   Hemoglobin 12.0 - 15.0 g/dL 76.1  95.0  93.2   HCT 36.0 - 46.0 % 42.3  44.3  41.6   Platelets 150 - 400 K/uL 274  281  290   NEUT# 1.4 - 7.0 x10E3/uL  2.9    Lymph# 0.7 -  3.1 x10E3/uL  2.1       There is no height or weight on file to calculate BMI.  Orders:  No orders of the defined types were placed in this encounter.  No orders of the defined types were placed in this encounter.    Procedures: No procedures performed  Clinical Data: No additional findings.  ROS:  All other systems negative, except as noted in the HPI. Review of Systems  Objective: Vital Signs: There were no vitals taken for this visit.  Specialty Comments:  No specialty comments available.  PMFS History: Patient Active Problem List   Diagnosis Date Noted   Achilles tendon contracture, left    Mild intermittent asthma with exacerbation 05/16/2021   Mild intermittent asthma without complication 05/16/2021   Environmental and seasonal allergies 05/16/2021   Allergic reaction 05/16/2021   Hypertension 09/27/2019   Insomnia 09/27/2019   Achilles tendon contracture, right 06/01/2017   Dysuria 07/22/2015   Microscopic hematuria 07/22/2015   GERD (gastroesophageal reflux disease) 07/17/2015   Anxiety 07/17/2015   Depression 07/17/2015   Hypercholesteremia 07/17/2015   IBS (irritable bowel  syndrome) 07/17/2015   Past Medical History:  Diagnosis Date   Allergic rhinitis    Anemia    iron deficiency anemia    Anxiety    Asthma    allergy related    Depression    [employer]reesia 09/25/2019   Diverticulosis    Family history of adverse reaction to anesthesia    pt stated that her mother had PONV   GERD (gastroesophageal reflux disease)    H/O hiatal hernia    repair with weight loss surgery    Headache    Hyperlipidemia    [employer]reesia 09/25/2019   Hypertension    IBS (irritable bowel syndrome)    PONV (postoperative nausea and vomiting)    RLS (restless legs syndrome)    Tears of meniscus and ACL of left knee    Wears glasses    Wears glasses     Family History  Problem Relation Age of Onset   Cancer Father        prostate   COPD Father    Liver disease  Mother    Asthma Mother    Depression Mother    Diabetes Mother    Hyperlipidemia Mother    Breast cancer Paternal Aunt    Hepatitis C Paternal Grandmother    Colon cancer Paternal Uncle    Cancer Maternal Grandfather     Past Surgical History:  Procedure Laterality Date   achilles lenghtening     ANTERIOR CRUCIATE LIGAMENT REPAIR Left 09/16/2018   Procedure: LEFT KNEE RECONSTRUCTION ANTERIOR CRUCIATE LIGAMENT (ACL) WITH HAMSTRING AUTOGRAFT MENISCAL REPAIR VS DEBRIDEMENT;  Surgeon: Cammy Copa, MD;  Location: [facility] OR;  Service: Orthopedics;  Laterality: Left;   CHOLECYSTECTOMY N/A 01/12/2014   Procedure: LAPAROSCOPIC CHOLECYSTECTOMY;  Surgeon: Glenna Fellows, MD;  Location: [facility];  Service: General;  Laterality: N/A;   COLONOSCOPY WITH ESOPHAGOGASTRODUODENOSCOPY (EGD)     gastric sleeve wtih hiatal hernia repair      GASTROCNEMIUS RECESSION Left 09/13/2021   Procedure: LEFT GASTROCNEMIUS RECESSION;  Surgeon: Nadara Mustard, MD;  Location: [facility] OR;  Service: Orthopedics;  Laterality: Left;   TONSILLECTOMY     uterine ablaton      Social History   Occupational History   Not on file  Tobacco Use   Smoking status: Never   Smokeless tobacco: Never  Vaping Use   Vaping Use: Never used  Substance and Sexual Activity   Alcohol use: Not Currently    Comment: rare   Drug use: No   Sexual activity: Yes    Birth control/protection: Surgical

## 2021-09-25 ENCOUNTER — Encounter: Payer: Self-pay | Admitting: Family

## 2021-09-25 ENCOUNTER — Ambulatory Visit (INDEPENDENT_AMBULATORY_CARE_PROVIDER_SITE_OTHER): Payer: BC Managed Care – PPO | Admitting: Family

## 2021-09-25 DIAGNOSIS — M7662 Achilles tendinitis, left leg: Secondary | ICD-10-CM

## 2021-09-25 MED ORDER — CEPHALEXIN 500 MG PO CAPS: 500.0000 mg | ORAL_CAPSULE | Freq: Three times a day (TID) | ORAL | 0 refills | Status: DC

## 2021-09-25 NOTE — Progress Notes (Signed)
Post-Op Visit Note   Patient: Paige Alvarez           Date of Birth: 12-05-1965           MRN: [id number] Visit Date: 09/25/2021 PCP: Mayer Masker, PA-C  Chief Complaint:  Chief Complaint  Patient presents with   Left Leg - Routine Post Op    09/13/21 left gastroc recession    HPI:  HPI The patient is a 56 year old woman who presents status post left gastroc anemias recession on June 23 she is about 2 weeks out she is concerned about some redness but she is has developed around her incision she states this is warm and tender  Denies fevers or chills Ortho Exam On examination of the left lower extremity there is no significant edema the incision is healing well it is not quite healed her sutures are in place there is some erythema and warmth which appears to be coming from irritation from the sutures Visit Diagnoses: No diagnosis found.  Plan: We will harvest the sutures today.  Steri-Strips applied.  She will continue with daily Dial soap cleansing.  Discussed sending in a prescription for Keflex if the redness does not resolve in 24 hours she will begin taking the Keflex.  Please call for any concerns follow-up in 2 weeks  Follow-Up Instructions: Return in about 12 days (around 10/07/2021).   Imaging: No results found.  Orders:  No orders of the defined types were placed in this encounter.  No orders of the defined types were placed in this encounter.    PMFS History: Patient Active Problem List   Diagnosis Date Noted   Achilles tendon contracture, left    Mild intermittent asthma with exacerbation 05/16/2021   Mild intermittent asthma without complication 05/16/2021   Environmental and seasonal allergies 05/16/2021   Allergic reaction 05/16/2021   Hypertension 09/27/2019   Insomnia 09/27/2019   Achilles tendon contracture, right 06/01/2017   Dysuria 07/22/2015   Microscopic hematuria 07/22/2015   GERD (gastroesophageal reflux disease) 07/17/2015   Anxiety  07/17/2015   Depression 07/17/2015   Hypercholesteremia 07/17/2015   IBS (irritable bowel syndrome) 07/17/2015   Past Medical History:  Diagnosis Date   Allergic rhinitis    Anemia    iron deficiency anemia    Anxiety    Asthma    allergy related    Depression    Phreesia 09/25/2019   Diverticulosis    Family history of adverse reaction to anesthesia    pt stated that her mother had PONV   GERD (gastroesophageal reflux disease)    H/O hiatal hernia    repair with weight loss surgery    Headache    Hyperlipidemia    Phreesia 09/25/2019   Hypertension    IBS (irritable bowel syndrome)    PONV (postoperative nausea and vomiting)    RLS (restless legs syndrome)    Tears of meniscus and ACL of left knee    Wears glasses    Wears glasses     Family History  Problem Relation Age of Onset   Cancer Father        prostate   COPD Father    Liver disease Mother    Asthma Mother    Depression Mother    Diabetes Mother    Hyperlipidemia Mother    Breast cancer Paternal Aunt    Hepatitis C Paternal Grandmother    Colon cancer Paternal Uncle    Cancer Maternal Grandfather     Past  Surgical History:  Procedure Laterality Date   achilles lenghtening     ANTERIOR CRUCIATE LIGAMENT REPAIR Left 09/16/2018   Procedure: LEFT KNEE RECONSTRUCTION ANTERIOR CRUCIATE LIGAMENT (ACL) WITH HAMSTRING AUTOGRAFT MENISCAL REPAIR VS DEBRIDEMENT;  Surgeon: Cammy Copa, MD;  Location: [facility] OR;  Service: Orthopedics;  Laterality: Left;   CHOLECYSTECTOMY N/A 01/12/2014   Procedure: LAPAROSCOPIC CHOLECYSTECTOMY;  Surgeon: Glenna Fellows, MD;  Location: [facility];  Service: General;  Laterality: N/A;   COLONOSCOPY WITH ESOPHAGOGASTRODUODENOSCOPY (EGD)     gastric sleeve wtih hiatal hernia repair      GASTROCNEMIUS RECESSION Left 09/13/2021   Procedure: LEFT GASTROCNEMIUS RECESSION;  Surgeon: Nadara Mustard, MD;  Location: [facility] OR;  Service: Orthopedics;  Laterality: Left;   TONSILLECTOMY      uterine ablaton      Social History   Occupational History   Not on file  Tobacco Use   Smoking status: Never   Smokeless tobacco: Never  Vaping Use   Vaping Use: Never used  Substance and Sexual Activity   Alcohol use: Not Currently    Comment: rare   Drug use: No   Sexual activity: Yes    Birth control/protection: Surgical

## 2021-10-09 ENCOUNTER — Encounter: Payer: Self-pay | Admitting: Family

## 2021-10-09 ENCOUNTER — Ambulatory Visit (INDEPENDENT_AMBULATORY_CARE_PROVIDER_SITE_OTHER): Payer: BC Managed Care – PPO | Admitting: Family

## 2021-10-09 DIAGNOSIS — M7662 Achilles tendinitis, left leg: Secondary | ICD-10-CM

## 2021-10-09 NOTE — Progress Notes (Signed)
Post-Op Visit Note   Patient: Paige Alvarez           Date of Birth: Jul 28, 1965           MRN: [id number] Visit Date: 10/09/2021 PCP: Mayer Masker, PA-C  Chief Complaint:  Chief Complaint  Patient presents with   Left Leg - Routine Post Op    09/13/21 left gastroc recession    HPI:  HPI The patient is a 56 year old woman who presents status post left gastrocnemius recession on June 23 she did take a course of Keflex following her last visit for some cellulitis around her incision she is pleased with how this is resolved she denies any issues with pain she has resumed her regular shoe shoewear and regular activities  States she has been working on scar massage Ortho Exam The incision to the left calf does well-healed there is no surrounding erythema no drainage no open area.  Currently has dorsiflexion to neutral  Visit Diagnoses: No diagnosis found.  Plan: Continue working on scar massage advance weightbearing as tolerated advance activities as she tolerates please work on ankle range of motion dorsiflexion  Follow-Up Instructions: No follow-ups on file.   Imaging: No results found.  Orders:  No orders of the defined types were placed in this encounter.  No orders of the defined types were placed in this encounter.    PMFS History: Patient Active Problem List   Diagnosis Date Noted   Achilles tendon contracture, left    Mild intermittent asthma with exacerbation 05/16/2021   Mild intermittent asthma without complication 05/16/2021   Environmental and seasonal allergies 05/16/2021   Allergic reaction 05/16/2021   Hypertension 09/27/2019   Insomnia 09/27/2019   Achilles tendon contracture, right 06/01/2017   Dysuria 07/22/2015   Microscopic hematuria 07/22/2015   GERD (gastroesophageal reflux disease) 07/17/2015   Anxiety 07/17/2015   Depression 07/17/2015   Hypercholesteremia 07/17/2015   IBS (irritable bowel syndrome) 07/17/2015   Past Medical History:   Diagnosis Date   Allergic rhinitis    Anemia    iron deficiency anemia    Anxiety    Asthma    allergy related    Depression    Phreesia 09/25/2019   Diverticulosis    Family history of adverse reaction to anesthesia    pt stated that her mother had PONV   GERD (gastroesophageal reflux disease)    H/O hiatal hernia    repair with weight loss surgery    Headache    Hyperlipidemia    Phreesia 09/25/2019   Hypertension    IBS (irritable bowel syndrome)    PONV (postoperative nausea and vomiting)    RLS (restless legs syndrome)    Tears of meniscus and ACL of left knee    Wears glasses    Wears glasses     Family History  Problem Relation Age of Onset   Cancer Father        prostate   COPD Father    Liver disease Mother    Asthma Mother    Depression Mother    Diabetes Mother    Hyperlipidemia Mother    Breast cancer Paternal Aunt    Hepatitis C Paternal Grandmother    Colon cancer Paternal Uncle    Cancer Maternal Grandfather     Past Surgical History:  Procedure Laterality Date   achilles lenghtening     ANTERIOR CRUCIATE LIGAMENT REPAIR Left 09/16/2018   Procedure: LEFT KNEE RECONSTRUCTION ANTERIOR CRUCIATE LIGAMENT (ACL) WITH HAMSTRING AUTOGRAFT  MENISCAL REPAIR VS DEBRIDEMENT;  Surgeon: Cammy Copa, MD;  Location: [facility] OR;  Service: Orthopedics;  Laterality: Left;   CHOLECYSTECTOMY N/A 01/12/2014   Procedure: LAPAROSCOPIC CHOLECYSTECTOMY;  Surgeon: Glenna Fellows, MD;  Location: [facility];  Service: General;  Laterality: N/A;   COLONOSCOPY WITH ESOPHAGOGASTRODUODENOSCOPY (EGD)     gastric sleeve wtih hiatal hernia repair      GASTROCNEMIUS RECESSION Left 09/13/2021   Procedure: LEFT GASTROCNEMIUS RECESSION;  Surgeon: Nadara Mustard, MD;  Location: [facility] OR;  Service: Orthopedics;  Laterality: Left;   TONSILLECTOMY     uterine ablaton      Social History   Occupational History   Not on file  Tobacco Use   Smoking status: Never   Smokeless tobacco: Never   Vaping Use   Vaping Use: Never used  Substance and Sexual Activity   Alcohol use: Not Currently    Comment: rare   Drug use: No   Sexual activity: Yes    Birth control/protection: Surgical

## 2021-10-18 ENCOUNTER — Other Ambulatory Visit: Payer: BC Managed Care – PPO

## 2021-10-23 ENCOUNTER — Encounter: Payer: BC Managed Care – PPO | Admitting: Nurse Practitioner

## 2021-12-05 ENCOUNTER — Ambulatory Visit (INDEPENDENT_AMBULATORY_CARE_PROVIDER_SITE_OTHER): Payer: BC Managed Care – PPO | Admitting: Nurse Practitioner

## 2021-12-05 ENCOUNTER — Encounter: Payer: Self-pay | Admitting: Nurse Practitioner

## 2021-12-05 VITALS — BP 138/91 | HR 68 | Ht 64.0 in | Wt 202.1 lb

## 2021-12-05 DIAGNOSIS — J3089 Other allergic rhinitis: Secondary | ICD-10-CM

## 2021-12-05 DIAGNOSIS — Z6835 Body mass index (BMI) 35.0-35.9, adult: Secondary | ICD-10-CM | POA: Diagnosis not present

## 2021-12-05 DIAGNOSIS — Z0001 Encounter for general adult medical examination with abnormal findings: Secondary | ICD-10-CM | POA: Diagnosis not present

## 2021-12-05 DIAGNOSIS — Z0000 Encounter for general adult medical examination without abnormal findings: Secondary | ICD-10-CM | POA: Diagnosis not present

## 2021-12-05 DIAGNOSIS — E559 Vitamin D deficiency, unspecified: Secondary | ICD-10-CM

## 2021-12-05 MED ORDER — MONTELUKAST SODIUM 10 MG PO TABS: 10.0000 mg | ORAL_TABLET | Freq: Every day | ORAL | 33 refills | Status: DC

## 2021-12-05 NOTE — Progress Notes (Signed)
Complete physical exam   Patient: Paige Alvarez   DOB: 1965-05-01   56 y.o. Female  MRN: [id number] Visit Date: 12/05/2021    Chief Complaint  Patient presents with   Annual Exam   Subjective    Paige Alvarez is a 56 y.o. female who presents today for a complete physical exam.  She reports consuming a  generally healthy  diet. Has been participating in [employer] and has lost 20 pounds since June when she started this program.   Meredeth Ide often, averaging 73220 to 14000 steps per day  She generally feels well. She does have additional problems to discuss today.   HPI  Annual physical. -was to have fasting blood work done prior to this visit.  -history of allergies and asthma  -has no  current concerns or complaints today   Past Medical History:  Diagnosis Date   Allergic rhinitis    Anemia    iron deficiency anemia    Anxiety    Asthma    allergy related    Depression    Phreesia 09/25/2019   Diverticulosis    Family history of adverse reaction to anesthesia    pt stated that her mother had PONV   GERD (gastroesophageal reflux disease)    H/O hiatal hernia    repair with weight loss surgery    Headache    Hyperlipidemia    Phreesia 09/25/2019   Hypertension    IBS (irritable bowel syndrome)    PONV (postoperative nausea and vomiting)    RLS (restless legs syndrome)    Tears of meniscus and ACL of left knee    Wears glasses    Wears glasses    Past Surgical History:  Procedure Laterality Date   achilles lenghtening     ANTERIOR CRUCIATE LIGAMENT REPAIR Left 09/16/2018   Procedure: LEFT KNEE RECONSTRUCTION ANTERIOR CRUCIATE LIGAMENT (ACL) WITH HAMSTRING AUTOGRAFT MENISCAL REPAIR VS DEBRIDEMENT;  Surgeon: Cammy Copa, MD;  Location: [facility];  Service: Orthopedics;  Laterality: Left;   CHOLECYSTECTOMY N/A 01/12/2014   Procedure: LAPAROSCOPIC CHOLECYSTECTOMY;  Surgeon: Glenna Fellows, MD;  Location: [facility];  Service: General;  Laterality: N/A;   COLONOSCOPY WITH  ESOPHAGOGASTRODUODENOSCOPY (EGD)     gastric sleeve wtih hiatal hernia repair      GASTROCNEMIUS RECESSION Left 09/13/2021   Procedure: LEFT GASTROCNEMIUS RECESSION;  Surgeon: Nadara Mustard, MD;  Location: [facility] OR;  Service: Orthopedics;  Laterality: Left;   TONSILLECTOMY     uterine ablaton      Social History   Socioeconomic History   Marital status: Married    Spouse name: Not on file   Number of children: Not on file   Years of education: Not on file   Highest education level: Not on file  Occupational History   Not on file  Tobacco Use   Smoking status: Never   Smokeless tobacco: Never  Vaping Use   Vaping Use: Never used  Substance and Sexual Activity   Alcohol use: Not Currently    Comment: rare   Drug use: No   Sexual activity: Yes    Birth control/protection: Surgical  Other Topics Concern   Not on file  Social History Narrative   Not on file   Social Determinants of Health   Financial Resource Strain: Not on file  Food Insecurity: Not on file  Transportation Needs: Not on file  Physical Activity: Not on file  Stress: Not on file  Social Connections: Not on file  Intimate  Partner Violence: Not on file   Family Status  Relation Name Status   Father  Alive   Mother  Deceased   Emelda Brothers  (Not Specified)   PGM  (Not Specified)   Oneal Grout  (Not Specified)   MGF  (Not Specified)   Family History  Problem Relation Age of Onset   Cancer Father        prostate   COPD Father    Liver disease Mother    Asthma Mother    Depression Mother    Diabetes Mother    Hyperlipidemia Mother    Breast cancer Paternal Aunt    Hepatitis C Paternal Grandmother    Colon cancer Paternal Uncle    Cancer Maternal Grandfather    Allergies  Allergen Reactions   Other Anaphylaxis and Swelling    "Anything with Mint". Ingested- Swelling. On skin- Starts to blister.    Adhesive [Tape] Itching    " surgical glue causes redness and itching." Denies itching with adhesive  tape   Cortisone Hives   Percocet [Oxycodone-Acetaminophen] Nausea Only   Statins Other (See Comments)    Muscle pains    Patient Care Team: Mayer Masker, PA-C as PCP - General (Physician Assistant) Carman Ching, MD (Inactive) as Consulting Physician (Gastroenterology)   Medications: Outpatient Medications Prior to Visit  Medication Sig   Biotin 29937 MCG TABS Take 10,000 mcg by mouth daily.   EPINEPHrine (EPIPEN 2-PAK) 0.3 mg/0.3 mL IJ SOAJ injection Inject 0.3 mg into the muscle as needed for anaphylaxis.   fexofenadine (ALLEGRA) 180 MG tablet Take 180 mg by mouth daily.    fluticasone (FLONASE) 50 MCG/ACT nasal spray Place 1 spray into both nostrils daily as needed for allergies or rhinitis.   HYDROcodone-acetaminophen (NORCO/VICODIN) 5-325 MG tablet Take 1 tablet by mouth every 4 (four) hours as needed.   triamcinolone cream (KENALOG) 0.1 % Apply 1 Application topically 2 (two) times daily as needed (eczema).   [DISCONTINUED] montelukast (SINGULAIR) 10 MG tablet TAKE 1 TABLET BY MOUTH EVERY DAY   [DISCONTINUED] albuterol (VENTOLIN HFA) 108 (90 Base) MCG/ACT inhaler Inhale 2 puffs into the lungs every 6 (six) hours as needed for wheezing or shortness of breath.   [DISCONTINUED] cephALEXin (KEFLEX) 500 MG capsule Take 1 capsule (500 mg total) by mouth 3 (three) times daily.   [DISCONTINUED] methylPREDNISolone (MEDROL DOSEPAK) 4 MG TBPK tablet Take 32 mg by mouth.   No facility-administered medications prior to visit.    Review of Systems  Constitutional:  Negative for activity change, appetite change, chills, fatigue and fever.  HENT:  Negative for congestion, postnasal drip, rhinorrhea, sinus pressure, sinus pain, sneezing and sore throat.   Eyes: Negative.   Respiratory:  Negative for cough, chest tightness, shortness of breath and wheezing.   Cardiovascular:  Negative for chest pain and palpitations.  Gastrointestinal:  Negative for abdominal pain, constipation, diarrhea,  nausea and vomiting.  Endocrine: Negative for cold intolerance, heat intolerance, polydipsia and polyuria.  Genitourinary:  Negative for dyspareunia, dysuria, flank pain, frequency and urgency.  Musculoskeletal:  Negative for arthralgias, back pain and myalgias.  Skin:  Negative for rash.  Allergic/Immunologic: Negative for environmental allergies.  Neurological:  Negative for dizziness, weakness and headaches.  Hematological:  Negative for adenopathy.  Psychiatric/Behavioral:  The patient is not nervous/anxious.       Objective     Today's Vitals   12/05/21 1547  BP: (Abnormal) 138/91  Pulse: 68  SpO2: 97%  Weight: 202 lb 1.9 oz (  91.7 kg)  Height: 5\' 4"  (1.626 m)   Body mass index is 34.69 kg/m.   BP Readings from Last 3 Encounters:  12/05/21 (Abnormal) 138/91  09/13/21 (Abnormal) 146/98  05/16/21 136/87    Wt Readings from Last 3 Encounters:  12/05/21 202 lb 1.9 oz (91.7 kg)  09/13/21 210 lb (95.3 kg)  05/16/21 214 lb 12.8 oz (97.4 kg)     Physical Exam Vitals and nursing note reviewed.  Constitutional:      Appearance: Normal appearance. She is well-developed.  HENT:     Head: Normocephalic and atraumatic.     Right Ear: Tympanic membrane, ear canal and external ear normal.     Left Ear: Tympanic membrane, ear canal and external ear normal.     Nose: Nose normal.     Mouth/Throat:     Mouth: Mucous membranes are moist.     Pharynx: Oropharynx is clear.  Eyes:     Extraocular Movements: Extraocular movements intact.     Conjunctiva/sclera: Conjunctivae normal.     Pupils: Pupils are equal, round, and reactive to light.  Neck:     Vascular: No carotid bruit.  Cardiovascular:     Rate and Rhythm: Normal rate and regular rhythm.     Pulses: Normal pulses.     Heart sounds: Normal heart sounds.  Pulmonary:     Effort: Pulmonary effort is normal.     Breath sounds: Normal breath sounds.  Abdominal:     General: Bowel sounds are normal. There is no  distension.     Palpations: Abdomen is soft. There is no mass.     Tenderness: There is no abdominal tenderness. There is no right CVA tenderness, left CVA tenderness, guarding or rebound.     Hernia: No hernia is present.  Musculoskeletal:        General: Normal range of motion.     Cervical back: Normal range of motion and neck supple.  Lymphadenopathy:     Cervical: No cervical adenopathy.  Skin:    General: Skin is warm and dry.     Capillary Refill: Capillary refill takes less than 2 seconds.  Neurological:     General: No focal deficit present.     Mental Status: She is alert and oriented to person, place, and time.  Psychiatric:        Mood and Affect: Mood normal.        Behavior: Behavior normal.        Thought Content: Thought content normal.        Judgment: Judgment normal.      Last depression screening scores   Row Labels 12/05/2021    3:53 PM 05/16/2021   10:06 AM 02/06/2020    8:20 AM  PHQ 2/9 Scores   Section Header. No data exists in this row.     PHQ - 2 Score   0 1 0  PHQ- 9 Score   2 4 0   Last fall risk screening   Row Labels 05/16/2021   10:06 AM  Fall Risk    Section Header. No data exists in this row.   Falls in the past year?   0  Number falls in past yr:   0  Injury with Fall?   0  Follow up   Falls evaluation completed    Assessment & Plan    1. Encounter for general adult medical examination with abnormal findings Annual physical today.  2. Environmental and seasonal allergies Patient should  add back Singulair 10 mg daily.  Continue over-the-counter Allegra daily. - montelukast (SINGULAIR) 10 MG tablet; Take 1 tablet (10 mg total) by mouth daily.  Dispense: 90 tablet; Refill: 33  3. Body mass index 35.0-35.9, adult Discussed lowering calorie intake to 1500 calories per day and incorporating exercise into daily routine to help lose weight.  - TSH + free T4; Future - Hemoglobin A1c; Future - Lipid panel; Future  4. Healthcare  maintenance Routine, fasting labs ordered during today's visit.  - TSH + free T4; Future - Hemoglobin A1c; Future - Lipid panel; Future - Comprehensive metabolic panel; Future - CBC; Future  5. Vitamin D deficiency Check vitamin D level and treat deficiency as indicated. - VITAMIN D 25 Hydroxy (Vit-D Deficiency, Fractures); Future    Health Maintenance  Topic Date Due   COVID-19 Vaccine (1) Never done   HIV Screening  Never done   Hepatitis C Screening  Never done   TETANUS/TDAP  Never done   Zoster Vaccines- Shingrix (1 of 2) Never done   PAP SMEAR-Modifier  04/12/2021   INFLUENZA VACCINE  Never done   MAMMOGRAM  06/04/2023   COLONOSCOPY (Pts 45-32yrs Insurance coverage will need to be confirmed)  08/08/2025   HPV VACCINES  Aged Out    Discussed health benefits of physical activity, and encouraged her to engage in regular exercise appropriate for her age and condition.  Problem List Items Addressed This Visit       Other   Environmental and seasonal allergies   Relevant Medications   montelukast (SINGULAIR) 10 MG tablet   Body mass index 35.0-35.9, adult   Relevant Orders   TSH + free T4   Hemoglobin A1c   Lipid panel   Other Visit Diagnoses     Encounter for general adult medical examination with abnormal findings    -  Primary   Healthcare maintenance       Relevant Orders   TSH + free T4   Hemoglobin A1c   Lipid panel   Comprehensive metabolic panel   CBC   Vitamin D deficiency       Relevant Orders   VITAMIN D 25 Hydroxy (Vit-D Deficiency, Fractures)        Return in about 1 year (around 12/06/2022) for health maintenance exam, FBW needed in the next few days/weeks .        Carlean Jews, NP  [facility] [phone] (phone) [phone] (fax)  [facility]

## 2021-12-22 DIAGNOSIS — Z6835 Body mass index (BMI) 35.0-35.9, adult: Secondary | ICD-10-CM | POA: Insufficient documentation

## 2021-12-22 DIAGNOSIS — Z6841 Body Mass Index (BMI) 40.0 and over, adult: Secondary | ICD-10-CM | POA: Insufficient documentation

## 2022-01-05 ENCOUNTER — Ambulatory Visit
Admission: EM | Admit: 2022-01-05 | Discharge: 2022-01-05 | Disposition: A | Discharge status: Home / Self Care | Payer: BC Managed Care – PPO | Attending: Urgent Care | Admitting: Urgent Care

## 2022-01-05 DIAGNOSIS — R109 Unspecified abdominal pain: Secondary | ICD-10-CM | POA: Diagnosis not present

## 2022-01-05 LAB — POCT URINALYSIS DIP (MANUAL ENTRY)
Bilirubin, UA: NEGATIVE
Glucose, UA: NEGATIVE mg/dL
Ketones, POC UA: NEGATIVE mg/dL
Leukocytes, UA: NEGATIVE
Nitrite, UA: NEGATIVE
Protein Ur, POC: NEGATIVE mg/dL
Spec Grav, UA: 1.025 (ref 1.010–1.025)
Urobilinogen, UA: 0.2 E.U./dL
pH, UA: 6 (ref 5.0–8.0)

## 2022-01-05 NOTE — ED Provider Notes (Signed)
[facility]    CSN: [id number] Arrival date & time: 01/05/22  1329      History   Chief Complaint Chief Complaint  Patient presents with   Back Pain    HPI Paige Alvarez is a 56 y.o. female.    Back Pain   Patient presents to UC with complaint of right-sided back pain and flank pain.  Took 1 dose of naproxen "a few days ago" without relief.  Patient endorses surgically absent gallbladder, S/P approximately 5 years.  Past Medical History:  Diagnosis Date   Allergic rhinitis    Anemia    iron deficiency anemia    Anxiety    Asthma    allergy related    Depression    Phreesia 09/25/2019   Diverticulosis    Family history of adverse reaction to anesthesia    pt stated that her mother had PONV   GERD (gastroesophageal reflux disease)    H/O hiatal hernia    repair with weight loss surgery    Headache    Hyperlipidemia    Phreesia 09/25/2019   Hypertension    IBS (irritable bowel syndrome)    PONV (postoperative nausea and vomiting)    RLS (restless legs syndrome)    Tears of meniscus and ACL of left knee    Wears glasses    Wears glasses     Patient Active Problem List   Diagnosis Date Noted   BMI 40.0-44.9, adult (HCC) 12/22/2021   Body mass index 35.0-35.9, adult 12/22/2021   Achilles tendon contracture, left    Mild intermittent asthma with exacerbation 05/16/2021   Mild intermittent asthma without complication 05/16/2021   Environmental and seasonal allergies 05/16/2021   Allergic reaction 05/16/2021   Hypertension 09/27/2019   Insomnia 09/27/2019   Achilles tendon contracture, right 06/01/2017   Dysuria 07/22/2015   Microscopic hematuria 07/22/2015   GERD (gastroesophageal reflux disease) 07/17/2015   Anxiety 07/17/2015   Depression 07/17/2015   Hypercholesteremia 07/17/2015   IBS (irritable bowel syndrome) 07/17/2015    Past Surgical History:  Procedure Laterality Date   achilles lenghtening     ANTERIOR CRUCIATE LIGAMENT  REPAIR Left 09/16/2018   Procedure: LEFT KNEE RECONSTRUCTION ANTERIOR CRUCIATE LIGAMENT (ACL) WITH HAMSTRING AUTOGRAFT MENISCAL REPAIR VS DEBRIDEMENT;  Surgeon: Cammy Copa, MD;  Location: [facility] OR;  Service: Orthopedics;  Laterality: Left;   CHOLECYSTECTOMY N/A 01/12/2014   Procedure: LAPAROSCOPIC CHOLECYSTECTOMY;  Surgeon: Glenna Fellows, MD;  Location: [facility];  Service: General;  Laterality: N/A;   COLONOSCOPY WITH ESOPHAGOGASTRODUODENOSCOPY (EGD)     gastric sleeve wtih hiatal hernia repair      GASTROCNEMIUS RECESSION Left 09/13/2021   Procedure: LEFT GASTROCNEMIUS RECESSION;  Surgeon: Nadara Mustard, MD;  Location: [facility] OR;  Service: Orthopedics;  Laterality: Left;   TONSILLECTOMY     uterine ablaton       OB History   No obstetric history on file.      Home Medications    Prior to Admission medications   Medication Sig Start Date End Date Taking? Authorizing Provider  Biotin 16384 MCG TABS Take 10,000 mcg by mouth daily.    [provider]  EPINEPHrine (EPIPEN 2-PAK) 0.3 mg/0.3 mL IJ SOAJ injection Inject 0.3 mg into the muscle as needed for anaphylaxis. 05/16/21   Carlean Jews, NP  fexofenadine (ALLEGRA) 180 MG tablet Take 180 mg by mouth daily.     [provider]  fluticasone (FLONASE) 50 MCG/ACT nasal spray Place 1 spray into  both nostrils daily as needed for allergies or rhinitis.    [provider]  HYDROcodone-acetaminophen (NORCO/VICODIN) 5-325 MG tablet Take 1 tablet by mouth every 4 (four) hours as needed. 09/13/21   Nadara Mustard, MD  montelukast (SINGULAIR) 10 MG tablet Take 1 tablet (10 mg total) by mouth daily. 12/05/21   Carlean Jews, NP  triamcinolone cream (KENALOG) 0.1 % Apply 1 Application topically 2 (two) times daily as needed (eczema).    [provider]    Family History Family History  Problem Relation Age of Onset   Cancer Father        prostate   COPD Father    Liver disease Mother    Asthma Mother     Depression Mother    Diabetes Mother    Hyperlipidemia Mother    Breast cancer Paternal Aunt    Hepatitis C Paternal Grandmother    Colon cancer Paternal Uncle    Cancer Maternal Grandfather     Social History Social History   Tobacco Use   Smoking status: Never   Smokeless tobacco: Never  Vaping Use   Vaping Use: Never used  Substance Use Topics   Alcohol use: Not Currently    Comment: rare   Drug use: No     Allergies   Other, Adhesive [tape], Cortisone, Percocet [oxycodone-acetaminophen], and Statins   Review of Systems Review of Systems  Musculoskeletal:  Positive for back pain.     Physical Exam Triage Vital Signs ED Triage Vitals  Enc Vitals Group     BP 01/05/22 1419 (!) 145/101     Pulse Rate 01/05/22 1419 81     Resp 01/05/22 1419 14     Temp 01/05/22 1419 97.7 F (36.5 C)     Temp src --      SpO2 01/05/22 1419 97 %     Weight --      Height --      Head Circumference --      Peak Flow --      Pain Score 01/05/22 1420 5     Pain Loc --      Pain Edu? --      Excl. in GC? --    No data found.  Updated Vital Signs BP (!) 145/101   Pulse 81   Temp 97.7 F (36.5 C)   Resp 14   SpO2 97%   Visual Acuity Right Eye Distance:   Left Eye Distance:   Bilateral Distance:    Right Eye Near:   Left Eye Near:    Bilateral Near:     Physical Exam Vitals reviewed.  Constitutional:      Appearance: Normal appearance.  Abdominal:     General: Abdomen is flat.     Tenderness: There is abdominal tenderness in the right upper quadrant. There is no right CVA tenderness. Positive signs include Murphy's sign.     Comments: Surgically absent gallbladder  Skin:    General: Skin is warm and dry.  Neurological:     General: No focal deficit present.     Mental Status: She is alert and oriented to person, place, and time.  Psychiatric:        Mood and Affect: Mood normal.        Behavior: Behavior normal.      UC Treatments / Results   Labs (all labs ordered are listed, but only abnormal results are displayed) Labs Reviewed - No data to display  EKG  Radiology No results found.  Procedures Procedures (including critical care time)  Medications Ordered in UC Medications - No data to display  Initial Impression / Assessment and Plan / UC Course  I have reviewed the triage vital signs and the nursing notes.  Pertinent labs & imaging results that were available during my care of the patient were reviewed by me and considered in my medical decision making (see chart for details).   Nonacute abdomen.  Tenderness in the right upper quadrant.  Positive Murphy sign with surgically absent gallbladder.  Negative right CVA tenderness.  UA did only for trace blood.  Last you a on record is from 6 years ago also with trace blood.  Patient endorses significant negative work-up with urology at that time.  Unable to determine if this is a chronic finding or acute finding.  Differential diagnosis includes renal calculi versus MSK.  Patient states she will follow-up with her primary care provider if her symptoms persist.   Final Clinical Impressions(s) / UC Diagnoses   Final diagnoses:  None   Discharge Instructions   None    ED Prescriptions   None    PDMP not reviewed this encounter.   Rose Phi, Van 01/05/22 1526

## 2022-01-05 NOTE — Discharge Instructions (Addendum)
Follow up with your primary care provider if your symptoms are worsening or not improving.    

## 2022-01-05 NOTE — ED Triage Notes (Signed)
Pt. Presents to UC w/ c/o right sided back pain and right sided flank pain. Pt. States she has only taken one dose of Naproxen a few days ago w/ no relief.

## 2022-01-10 ENCOUNTER — Ambulatory Visit (INDEPENDENT_AMBULATORY_CARE_PROVIDER_SITE_OTHER): Payer: BC Managed Care – PPO | Admitting: Physician Assistant

## 2022-01-10 ENCOUNTER — Encounter: Payer: Self-pay | Admitting: Physician Assistant

## 2022-01-10 ENCOUNTER — Other Ambulatory Visit: Payer: BC Managed Care – PPO

## 2022-01-10 VITALS — BP 140/84 | HR 75 | Temp 98.0°F | Ht 63.5 in | Wt 201.0 lb

## 2022-01-10 DIAGNOSIS — F439 Reaction to severe stress, unspecified: Secondary | ICD-10-CM | POA: Diagnosis not present

## 2022-01-10 DIAGNOSIS — M791 Myalgia, unspecified site: Secondary | ICD-10-CM | POA: Diagnosis not present

## 2022-01-10 DIAGNOSIS — R109 Unspecified abdominal pain: Secondary | ICD-10-CM

## 2022-01-10 DIAGNOSIS — F419 Anxiety disorder, unspecified: Secondary | ICD-10-CM

## 2022-01-10 LAB — POCT URINALYSIS DIPSTICK
Bilirubin, UA: NEGATIVE
Glucose, UA: NEGATIVE
Ketones, UA: NEGATIVE
Leukocytes, UA: NEGATIVE
Nitrite, UA: NEGATIVE
Protein, UA: NEGATIVE
Spec Grav, UA: <=1.005 — AB (ref 1.010–1.025)
Urobilinogen, UA: 0.2 E.U./dL
pH, UA: 7 (ref 5.0–8.0)

## 2022-01-10 MED ORDER — METHOCARBAMOL 500 MG PO TABS: 500.0000 mg | ORAL_TABLET | Freq: Two times a day (BID) | ORAL | 0 refills | Status: DC | PRN

## 2022-01-10 MED ORDER — FLUOXETINE HCL 10 MG PO TABS: 10.0000 mg | ORAL_TABLET | Freq: Every day | ORAL | 1 refills | Status: DC

## 2022-01-10 NOTE — Progress Notes (Deleted)
r 

## 2022-01-10 NOTE — Progress Notes (Unsigned)
Established patient acute visit   Patient: Paige Alvarez   DOB: [birth year]   56 y.o. Female  MRN: [id number] Visit Date: 01/10/2022  Chief Complaint  Patient presents with   Acute Visit   Subjective    HPI  Patient presents with c/o bilateral flank pain. Reports first started right flank pain about 2 weeks ago and then developed left flank pain. States pain is worse in the mornings and gets better throughout the day. Denies dysuria, urinary frequency, fever, abdominal pain or urinary urgency. Has been stretching. Patient reports currently has increased stress at home and feels more tense.     Medications: Outpatient Medications Prior to Visit  Medication Sig   Biotin 25956 MCG TABS Take 10,000 mcg by mouth daily.   EPINEPHrine (EPIPEN 2-PAK) 0.3 mg/0.3 mL IJ SOAJ injection Inject 0.3 mg into the muscle as needed for anaphylaxis.   fexofenadine (ALLEGRA) 180 MG tablet Take 180 mg by mouth daily.    fluticasone (FLONASE) 50 MCG/ACT nasal spray Place 1 spray into both nostrils daily as needed for allergies or rhinitis.   montelukast (SINGULAIR) 10 MG tablet Take 1 tablet (10 mg total) by mouth daily.   triamcinolone cream (KENALOG) 0.1 % Apply 1 Application topically 2 (two) times daily as needed (eczema).   HYDROcodone-acetaminophen (NORCO/VICODIN) 5-325 MG tablet Take 1 tablet by mouth every 4 (four) hours as needed. (Patient not taking: Reported on 01/10/2022)   No facility-administered medications prior to visit.    Review of Systems Review of Systems:  A fourteen system review of systems was performed and found to be positive as per HPI.  Last CBC Lab Results  Component Value Date   WBC 7.7 09/13/2021   HGB 14.4 09/13/2021   HCT 42.3 09/13/2021   MCV 88.1 09/13/2021   MCH 30.0 09/13/2021   RDW 12.9 09/13/2021   PLT 274 09/13/2021   Last metabolic panel Lab Results  Component Value Date   GLUCOSE 104 (H) 09/13/2021   NA 138 09/13/2021   K 3.3 (L) 09/13/2021    CL 103 09/13/2021   CO2 23 09/13/2021   BUN 12 09/13/2021   CREATININE 0.63 09/13/2021   GFRNONAA >60 09/13/2021   CALCIUM 9.2 09/13/2021   PHOS 3.1 05/01/2011   PROT 6.9 02/06/2020   ALBUMIN 4.7 02/06/2020   LABGLOB 2.2 02/06/2020   AGRATIO 2.1 02/06/2020   BILITOT 0.4 02/06/2020   ALKPHOS 82 02/06/2020   AST 20 02/06/2020   ALT 18 02/06/2020   ANIONGAP 12 09/13/2021   Last lipids Lab Results  Component Value Date   CHOL 215 (H) 02/06/2020   HDL 51 02/06/2020   LDLCALC 146 (H) 02/06/2020   TRIG 101 02/06/2020   CHOLHDL 4.2 02/06/2020   Last hemoglobin A1c Lab Results  Component Value Date   HGBA1C 5.8 (H) 02/06/2020   Last thyroid functions Lab Results  Component Value Date   TSH 1.940 02/06/2020       Objective    BP (!) 140/84   Pulse 75   Temp 98 F (36.7 C) (Temporal)   Ht 5' 3.5" (1.613 m)   Wt 201 lb (91.2 kg)   BMI 35.05 kg/m    Physical Exam  General:  Well Developed, well nourished, appropriate for stated age.  Neuro:  Alert and oriented,  extra-ocular muscles intact  HEENT:  Normocephalic, atraumatic, neck supple  Skin:  no gross rash, warm, pink. Abdomen: non-tender, non-distended, no CVA tenderness Cardiac:  RRR, S1 S2 Respiratory: CTA B/L  MSK: no midline tenderness, tenderness of paraspinal lumbar muscles, no step-off  Vascular:  Ext warm, no cyanosis apprec.; cap RF less 2 sec. Psych:  No HI/SI, judgement and insight good, Euthymic mood. Full Affect.   No results found for any visits on 01/10/22.  Assessment & Plan     Discussed with patient UA unremarkable with exception of hematuria which is chronic, will send for urine culture to r/o UTI. Discussed with patient symptoms are more suggestive of MSK etiology, will trial muscle relaxer to take especially before bedtime as needed. Due to acute stress contributing to increased anxiety will start Prozac 10 mg daily, advised patient to let me know if unable to tolerate medication. Patient  previously has been on Lexapro and wants to try something different. Pt verbalized understanding. Recommend reassessing mood and treatment therapy in 8 weeks. Recommend repeating BP at follow-up visit.   Return in about 8 weeks (around 03/07/2022) for mood, stress.        Lorrene Reid, PA-C  [facility] at [facility] [phone] (phone) [phone] (fax)  [place]

## 2022-01-12 LAB — URINE CULTURE

## 2022-03-12 ENCOUNTER — Other Ambulatory Visit: Payer: Self-pay | Admitting: Nurse Practitioner

## 2022-03-12 DIAGNOSIS — F439 Reaction to severe stress, unspecified: Secondary | ICD-10-CM

## 2022-03-12 DIAGNOSIS — F419 Anxiety disorder, unspecified: Secondary | ICD-10-CM

## 2022-03-17 NOTE — Progress Notes (Signed)
Established patient visit   Patient: Paige Alvarez   DOB: June 23, 1965   56 y.o. Female  MRN: [id number] Visit Date: 03/18/2022   Chief Complaint  Patient presents with   Follow-up   Stress   Subjective    HPI  Follow up  -increased stress at  home  --started prozac 10 mg daily  ---continues to  have some episodes of panic and anxiety.   Elevated blood pressure --improved by end of visit   -She denies chest pain, chest pressure, or shortness of breath. She denies headaches or visual disturbances. She denies abdominal pain, nausea, vomiting, or changes in bowel or bladder habits.     Medications: Outpatient Medications Prior to Visit  Medication Sig   Biotin 40981 MCG TABS Take 10,000 mcg by mouth daily.   EPINEPHrine (EPIPEN 2-PAK) 0.3 mg/0.3 mL IJ SOAJ injection Inject 0.3 mg into the muscle as needed for anaphylaxis.   fexofenadine (ALLEGRA) 180 MG tablet Take 180 mg by mouth daily.    fluticasone (FLONASE) 50 MCG/ACT nasal spray Place 1 spray into both nostrils daily as needed for allergies or rhinitis.   HYDROcodone-acetaminophen (NORCO/VICODIN) 5-325 MG tablet Take 1 tablet by mouth every 4 (four) hours as needed.   methocarbamol (ROBAXIN) 500 MG tablet Take 1 tablet (500 mg total) by mouth 2 (two) times daily as needed for muscle spasms.   montelukast (SINGULAIR) 10 MG tablet Take 1 tablet (10 mg total) by mouth daily.   triamcinolone cream (KENALOG) 0.1 % Apply 1 Application topically 2 (two) times daily as needed (eczema).   [DISCONTINUED] FLUoxetine (PROZAC) 10 MG tablet TAKE 1 TABLET (10 MG TOTAL) BY MOUTH DAILY.   No facility-administered medications prior to visit.    Review of Systems  Constitutional:  Negative for activity change, appetite change, chills, fatigue and fever.  HENT:  Negative for congestion, postnasal drip, rhinorrhea, sinus pressure, sinus pain, sneezing and sore throat.   Eyes: Negative.   Respiratory:  Negative for cough, chest tightness,  shortness of breath and wheezing.   Cardiovascular:  Negative for chest pain and palpitations.  Gastrointestinal:  Negative for abdominal pain, constipation, diarrhea, nausea and vomiting.  Endocrine: Negative for cold intolerance, heat intolerance, polydipsia and polyuria.  Genitourinary:  Negative for dyspareunia, dysuria, flank pain, frequency and urgency.  Musculoskeletal:  Negative for arthralgias, back pain and myalgias.  Skin:  Negative for rash.  Allergic/Immunologic: Negative for environmental allergies.  Neurological:  Negative for dizziness, weakness and headaches.  Hematological:  Negative for adenopathy.  Psychiatric/Behavioral:  Positive for dysphoric mood and sleep disturbance. The patient is nervous/anxious.        Improving with new addition of prozac 10 mg daily     Last CBC Lab Results  Component Value Date   WBC 7.7 09/13/2021   HGB 14.4 09/13/2021   HCT 42.3 09/13/2021   MCV 88.1 09/13/2021   MCH 30.0 09/13/2021   RDW 12.9 09/13/2021   PLT 274 09/13/2021   Last metabolic panel Lab Results  Component Value Date   GLUCOSE 104 (H) 09/13/2021   NA 138 09/13/2021   K 3.3 (L) 09/13/2021   CL 103 09/13/2021   CO2 23 09/13/2021   BUN 12 09/13/2021   CREATININE 0.63 09/13/2021   GFRNONAA >60 09/13/2021   CALCIUM 9.2 09/13/2021   PHOS 3.1 05/01/2011   PROT 6.9 02/06/2020   ALBUMIN 4.7 02/06/2020   LABGLOB 2.2 02/06/2020   AGRATIO 2.1 02/06/2020   BILITOT 0.4 02/06/2020   ALKPHOS 82  02/06/2020   AST 20 02/06/2020   ALT 18 02/06/2020   ANIONGAP 12 09/13/2021   Last lipids Lab Results  Component Value Date   CHOL 215 (H) 02/06/2020   HDL 51 02/06/2020   LDLCALC 146 (H) 02/06/2020   TRIG 101 02/06/2020   CHOLHDL 4.2 02/06/2020   Last hemoglobin A1c Lab Results  Component Value Date   HGBA1C 5.8 (H) 02/06/2020   Last thyroid functions Lab Results  Component Value Date   TSH 1.940 02/06/2020       Objective     Today's Vitals   03/18/22  1054 03/18/22 1135  BP: (Abnormal) 160/94 123/89  Pulse: 71 71  Resp: 18   SpO2: 95%   Weight: 204 lb (92.5 kg)   Height: 5' 3.5" (1.613 m)    Body mass index is 35.57 kg/m.  BP Readings from Last 3 Encounters:  03/18/22 123/89  01/10/22 (Abnormal) 140/84  01/05/22 (Abnormal) 145/101    Wt Readings from Last 3 Encounters:  03/18/22 204 lb (92.5 kg)  01/10/22 201 lb (91.2 kg)  12/05/21 202 lb 1.9 oz (91.7 kg)    Physical Exam Vitals and nursing note reviewed.  Constitutional:      Appearance: Normal appearance. She is well-developed.  HENT:     Head: Normocephalic and atraumatic.     Mouth/Throat:     Mouth: Mucous membranes are moist.     Pharynx: Oropharynx is clear.  Eyes:     Extraocular Movements: Extraocular movements intact.     Conjunctiva/sclera: Conjunctivae normal.     Pupils: Pupils are equal, round, and reactive to light.  Cardiovascular:     Rate and Rhythm: Normal rate and regular rhythm.     Pulses: Normal pulses.     Heart sounds: Normal heart sounds.  Pulmonary:     Effort: Pulmonary effort is normal.     Breath sounds: Normal breath sounds.  Abdominal:     Palpations: Abdomen is soft.  Musculoskeletal:        General: Normal range of motion.     Cervical back: Normal range of motion and neck supple.  Lymphadenopathy:     Cervical: No cervical adenopathy.  Skin:    General: Skin is warm and dry.     Capillary Refill: Capillary refill takes less than 2 seconds.  Neurological:     General: No focal deficit present.     Mental Status: She is alert and oriented to person, place, and time.  Psychiatric:        Mood and Affect: Mood normal.        Behavior: Behavior normal.        Thought Content: Thought content normal.        Judgment: Judgment normal.      Assessment & Plan    1. Anxiety Gradually increase fluoxetine to 20 mg daily. Start buspirone 10 mg up to twice daily as needed for acute anxiety.   - FLUoxetine (PROZAC) 10 MG tablet;  Take 2 tablets (20 mg total) by mouth daily.  Dispense: 60 tablet; Refill: 2 - busPIRone (BUSPAR) 10 MG tablet; Take 1 tablet (10 mg total) by mouth 2 (two) times daily as needed.  Dispense: 60 tablet; Refill: 2  2. Stress at home Gradually increase fluoxetine to 20 mg daily. Start buspirone 10 mg up to twice daily as needed for acute anxiety.   - FLUoxetine (PROZAC) 10 MG tablet; Take 2 tablets (20 mg total) by mouth daily.  Dispense: 60  tablet; Refill: 2  3. Body mass index 35.0-35.9, adult Discussed lowering calorie intake to 1500 calories per day and incorporating exercise into daily routine to help lose weight.     Problem List Items Addressed This Visit       Other   Anxiety - Primary   Relevant Medications   FLUoxetine (PROZAC) 10 MG tablet   busPIRone (BUSPAR) 10 MG tablet   Body mass index 35.0-35.9, adult   Stress at home   Relevant Medications   FLUoxetine (PROZAC) 10 MG tablet     Return in about 4 weeks (around 04/15/2022) for mood.         Carlean Jews, NP  [facility] at [facility] [phone] (phone) [phone] (fax)  [facility]

## 2022-03-18 ENCOUNTER — Ambulatory Visit: Payer: BC Managed Care – PPO | Admitting: Nurse Practitioner

## 2022-03-18 ENCOUNTER — Encounter: Payer: Self-pay | Admitting: Nurse Practitioner

## 2022-03-18 VITALS — BP 123/89 | HR 71 | Resp 18 | Ht 63.5 in | Wt 204.0 lb

## 2022-03-18 DIAGNOSIS — Z6835 Body mass index (BMI) 35.0-35.9, adult: Secondary | ICD-10-CM | POA: Diagnosis not present

## 2022-03-18 DIAGNOSIS — F439 Reaction to severe stress, unspecified: Secondary | ICD-10-CM | POA: Diagnosis not present

## 2022-03-18 DIAGNOSIS — F419 Anxiety disorder, unspecified: Secondary | ICD-10-CM | POA: Diagnosis not present

## 2022-03-18 MED ORDER — BUSPIRONE HCL 10 MG PO TABS: 10.0000 mg | ORAL_TABLET | Freq: Two times a day (BID) | ORAL | 2 refills | Status: DC | PRN

## 2022-03-18 MED ORDER — FLUOXETINE HCL 10 MG PO TABS: 20.0000 mg | ORAL_TABLET | Freq: Every day | ORAL | 2 refills | Status: DC

## 2022-04-13 DIAGNOSIS — F439 Reaction to severe stress, unspecified: Secondary | ICD-10-CM | POA: Insufficient documentation

## 2022-04-23 ENCOUNTER — Encounter: Payer: Self-pay | Admitting: Nurse Practitioner

## 2022-04-23 ENCOUNTER — Ambulatory Visit: Payer: BC Managed Care – PPO | Admitting: Nurse Practitioner

## 2022-04-23 VITALS — BP 155/90 | HR 65 | Ht 63.5 in | Wt 210.4 lb

## 2022-04-23 DIAGNOSIS — F419 Anxiety disorder, unspecified: Secondary | ICD-10-CM | POA: Diagnosis not present

## 2022-04-23 DIAGNOSIS — M545 Low back pain, unspecified: Secondary | ICD-10-CM | POA: Diagnosis not present

## 2022-04-23 DIAGNOSIS — F439 Reaction to severe stress, unspecified: Secondary | ICD-10-CM | POA: Diagnosis not present

## 2022-04-23 DIAGNOSIS — Z6835 Body mass index (BMI) 35.0-35.9, adult: Secondary | ICD-10-CM

## 2022-04-23 MED ORDER — METHYLPREDNISOLONE 4 MG PO TBPK: ORAL_TABLET | ORAL | 0 refills | Status: DC

## 2022-04-23 MED ORDER — TIZANIDINE HCL 4 MG PO TABS: ORAL_TABLET | ORAL | 1 refills | Status: AC

## 2022-04-23 NOTE — Progress Notes (Signed)
Established patient visit   Patient: Paige Alvarez   DOB: 05-27-1965   57 y.o. Female  MRN: [id number] Visit Date: 04/23/2022   Chief Complaint  Patient presents with   Follow-up   Subjective    HPI  Follow up  -anxiety  --increased prozac to 20 mg  --added buspirone  --reports that improved anxiety  --sleeping well.  --managing stress better.   Back pain --lower left aspect of the back --happened suddenly after coughing about 2 weeks ago  --hurts to bend and twist  --getting up first thing in morning is painful and gets better as she forces herself to move and stretch.   -She denies chest pain, chest pressure, or shortness of breath. She denies headaches or visual disturbances. She denies abdominal pain, nausea, vomiting, or changes in bowel or bladder habits.     Medications: Outpatient Medications Prior to Visit  Medication Sig   Biotin 10000 MCG TABS Take 10,000 mcg by mouth daily.   busPIRone (BUSPAR) 10 MG tablet Take 1 tablet (10 mg total) by mouth 2 (two) times daily as needed.   EPINEPHrine (EPIPEN 2-PAK) 0.3 mg/0.3 mL IJ SOAJ injection Inject 0.3 mg into the muscle as needed for anaphylaxis.   fexofenadine (ALLEGRA) 180 MG tablet Take 180 mg by mouth daily.    FLUoxetine (PROZAC) 10 MG tablet Take 2 tablets (20 mg total) by mouth daily.   fluticasone (FLONASE) 50 MCG/ACT nasal spray Place 1 spray into both nostrils daily as needed for allergies or rhinitis.   HYDROcodone-acetaminophen (NORCO/VICODIN) 5-325 MG tablet Take 1 tablet by mouth every 4 (four) hours as needed.   montelukast (SINGULAIR) 10 MG tablet Take 1 tablet (10 mg total) by mouth daily.   triamcinolone cream (KENALOG) 0.1 % Apply 1 Application topically 2 (two) times daily as needed (eczema).   [DISCONTINUED] methocarbamol (ROBAXIN) 500 MG tablet Take 1 tablet (500 mg total) by mouth 2 (two) times daily as needed for muscle spasms.   No facility-administered medications prior to visit.     Review of Systems See HPI      Objective     Today's Vitals   04/23/22 1604  BP: (Abnormal) 155/90  Pulse: 65  SpO2: 98%  Weight: 210 lb 6.4 oz (95.4 kg)  Height: 5' 3.5" (1.613 m)   Body mass index is 36.69 kg/m.  BP Readings from Last 3 Encounters:  04/23/22 (Abnormal) 155/90  03/18/22 123/89  01/10/22 (Abnormal) 140/84    Wt Readings from Last 3 Encounters:  04/23/22 210 lb 6.4 oz (95.4 kg)  03/18/22 204 lb (92.5 kg)  01/10/22 201 lb (91.2 kg)    Physical Exam Vitals and nursing note reviewed.  Constitutional:      Appearance: Normal appearance. She is well-developed.  HENT:     Head: Normocephalic and atraumatic.     Nose: Nose normal.     Mouth/Throat:     Mouth: Mucous membranes are moist.     Pharynx: Oropharynx is clear.  Eyes:     Extraocular Movements: Extraocular movements intact.     Conjunctiva/sclera: Conjunctivae normal.     Pupils: Pupils are equal, round, and reactive to light.  Cardiovascular:     Rate and Rhythm: Normal rate and regular rhythm.     Pulses: Normal pulses.     Heart sounds: Normal heart sounds.  Pulmonary:     Effort: Pulmonary effort is normal.     Breath sounds: Normal breath sounds.  Abdominal:     Palpations:  Abdomen is soft.  Musculoskeletal:        General: Normal range of motion.     Cervical back: Normal range of motion and neck supple.  Lymphadenopathy:     Cervical: No cervical adenopathy.  Skin:    General: Skin is warm and dry.     Capillary Refill: Capillary refill takes less than 2 seconds.  Neurological:     General: No focal deficit present.     Mental Status: She is alert and oriented to person, place, and time.  Psychiatric:        Mood and Affect: Mood normal.        Behavior: Behavior normal.        Thought Content: Thought content normal.        Judgment: Judgment normal.     Assessment & Plan    1. Acute left-sided low back pain without sciatica Recommend she take  OTC tylenol and/or  ibuprofen to reduce pain. Add medrol taper. Take as directed for  6 days. Tizanidine 4 mg myay be taken  at bedtime as needed for muscle pain/spasms. Recommend she use heating pad on effected areas in 20 minute intervals. Refer to orthopedics as indicated.  - methylPREDNISolone (MEDROL) 4 MG TBPK tablet; Take by mouth as directed for 6 days  Dispense: 21 tablet; Refill: 0 - tiZANidine (ZANAFLEX) 4 MG tablet; Take 0.5 to 1 tablet po QHS prn muscle pain  Dispense: 30 tablet; Refill: 1  2. Anxiety Improved. Continue with current dose prozac and buspirone as prescribed   3. Stress at home Managing better with increased dose medications. Will monitor.   4. Body mass index 35.0-35.9, adult Discussed lowering calorie intake to 1500 calories per day and incorporating exercise into daily routine to help lose weight.     Problem List Items Addressed This Visit       Other   Anxiety   Body mass index 35.0-35.9, adult   Stress at home   Acute left-sided low back pain without sciatica - Primary   Relevant Medications   methylPREDNISolone (MEDROL) 4 MG TBPK tablet   tiZANidine (ZANAFLEX) 4 MG tablet     Return in about 4 months (around 08/22/2022) for mood.         Ronnell Freshwater, NP  [facility] at [facility] [phone] (phone) [phone] (fax)  [place]

## 2022-05-18 DIAGNOSIS — M545 Low back pain, unspecified: Secondary | ICD-10-CM | POA: Insufficient documentation

## 2022-06-28 ENCOUNTER — Other Ambulatory Visit: Payer: Self-pay | Admitting: Nurse Practitioner

## 2022-06-28 DIAGNOSIS — F439 Reaction to severe stress, unspecified: Secondary | ICD-10-CM

## 2022-06-28 DIAGNOSIS — F419 Anxiety disorder, unspecified: Secondary | ICD-10-CM

## 2022-09-08 ENCOUNTER — Encounter: Payer: Self-pay | Admitting: Family Medicine

## 2022-09-08 ENCOUNTER — Ambulatory Visit: Payer: BC Managed Care – PPO | Admitting: Family Medicine

## 2022-09-08 VITALS — BP 169/84 | HR 70 | Temp 97.7°F | Ht 63.5 in | Wt 210.8 lb

## 2022-09-08 DIAGNOSIS — I10 Essential (primary) hypertension: Secondary | ICD-10-CM | POA: Diagnosis not present

## 2022-09-08 DIAGNOSIS — F419 Anxiety disorder, unspecified: Secondary | ICD-10-CM

## 2022-09-08 DIAGNOSIS — F324 Major depressive disorder, single episode, in partial remission: Secondary | ICD-10-CM | POA: Diagnosis not present

## 2022-09-08 MED ORDER — FLUOXETINE HCL 20 MG PO TABS: 20.0000 mg | ORAL_TABLET | Freq: Every day | ORAL | 3 refills | Status: DC

## 2022-09-08 MED ORDER — BUSPIRONE HCL 10 MG PO TABS: 10.0000 mg | ORAL_TABLET | Freq: Two times a day (BID) | ORAL | 2 refills | Status: DC | PRN

## 2022-09-08 NOTE — Assessment & Plan Note (Signed)
GAD-7 score 5.  Patient states that mood is fairly stable, she would like to continue current medication regimen.  Continue Prozac 20 mg daily, buspirone 10 mg twice daily.  Will continue to monitor.

## 2022-09-08 NOTE — Assessment & Plan Note (Signed)
PHQ-9 score of 1. Continue Prozac 20 mg daily, buspirone 10 mg twice daily.  Will continue to monitor.

## 2022-09-08 NOTE — Assessment & Plan Note (Addendum)
Blood pressure elevated 149/88 initially, on recheck 169/84.  Patient was previously on amlodipine 5 mg daily but eventually discontinued.  She has noted that typically her blood pressure stays within the goal range if she is less than 200 pounds.  She is currently working on weight loss following weight watchers with her husband.  She would like to try to manage blood pressure in this way until her upcoming annual physical before readding amlodipine to her medication regimen.

## 2022-09-08 NOTE — Progress Notes (Signed)
Established Patient Office Visit  Subjective   Patient ID: Paige Alvarez, female    DOB: Jul 18, 1965  Age: 57 y.o. MRN: [id number]  Chief Complaint  Patient presents with   Follow Up Mood    HPI Paige Alvarez is a 57 y.o. female presenting today for follow up of mood. Mood: Patient is here to follow up for depression and anxiety, currently managing with Prozac and buspirone. Taking medication without side effects, reports excellent compliance with treatment. Denies mood changes or SI/HI. She feels mood is fairly stable since last visit.  She does not want to change her medication regimen right now, but she does continue to have stressors within her marriage.  She has also noticed that she has had a decrease in her sex drive.  It is not bothersome for her to the point where she would like to treat it, but she did want to note it.     09/08/2022    8:18 AM 04/23/2022    4:07 PM 03/18/2022   10:57 AM  Depression screen PHQ 2/9  Decreased Interest 0 0 1  Down, Depressed, Hopeless 0 1 1  PHQ - 2 Score 0 1 2  Altered sleeping 1 0 1  Tired, decreased energy 0 0 1  Change in appetite 0 0 1  Feeling bad or failure about yourself  0 0 1  Trouble concentrating 0 0 0  Moving slowly or fidgety/restless 0 0 0  Suicidal thoughts 0 0 0  PHQ-9 Score 1 1 6   Difficult doing work/chores Not difficult at all  Somewhat difficult       09/08/2022    8:19 AM 04/23/2022    4:07 PM 03/18/2022   10:57 AM 01/10/2022   10:03 AM  GAD 7 : Generalized Anxiety Score  Nervous, Anxious, on Edge 1 0 1 3  Control/stop worrying 0 0 1 1  Worry too much - different things 0 0 1 0  Trouble relaxing 1 0 1 1  Restless 2 0 1 0  Easily annoyed or irritable 1 1 1 3   Afraid - awful might happen 0 0 0 0  Total GAD 7 Score 5 1 6 8   Anxiety Difficulty Not difficult at all  Somewhat difficult Somewhat difficult   ROS Negative unless otherwise noted in HPI   Objective:     BP (!) 169/84   Pulse 70   Temp  97.7 F (36.5 C) (Oral)   Ht 5' 3.5" (1.613 m)   Wt 210 lb 12 oz (95.6 kg)   SpO2 95%   BMI 36.75 kg/m   Physical Exam Constitutional:      General: She is not in acute distress.    Appearance: Normal appearance.  HENT:     Head: Normocephalic and atraumatic.  Cardiovascular:     Rate and Rhythm: Normal rate and regular rhythm.     Heart sounds: No murmur heard.    No friction rub. No gallop.  Pulmonary:     Effort: Pulmonary effort is normal. No respiratory distress.     Breath sounds: No wheezing, rhonchi or rales.  Skin:    General: Skin is warm and dry.  Neurological:     Mental Status: She is alert and oriented to person, place, and time.     Assessment & Plan:  Anxiety Assessment & Plan: GAD-7 score 5.  Patient states that mood is fairly stable, she would like to continue current medication regimen.  Continue Prozac 20 mg  daily, buspirone 10 mg twice daily.  Will continue to monitor.  Orders: -     busPIRone HCl; Take 1 tablet (10 mg total) by mouth 2 (two) times daily as needed.  Dispense: 60 tablet; Refill: 2 -     FLUoxetine HCl; Take 1 tablet (20 mg total) by mouth daily.  Dispense: 90 tablet; Refill: 3  Major depressive disorder with single episode, in partial remission (HCC) Assessment & Plan: PHQ-9 score of 1. Continue Prozac 20 mg daily, buspirone 10 mg twice daily.  Will continue to monitor.  Orders: -     busPIRone HCl; Take 1 tablet (10 mg total) by mouth 2 (two) times daily as needed.  Dispense: 60 tablet; Refill: 2 -     FLUoxetine HCl; Take 1 tablet (20 mg total) by mouth daily.  Dispense: 90 tablet; Refill: 3  Hypertension, unspecified type Assessment & Plan: Blood pressure elevated 149/88 initially, on recheck.  Patient was previously on amlodipine 5 mg daily but eventually discontinued.  She has noted that typically her blood pressure stays within the goal range if she is less than 200 pounds.  She is currently working on weight loss following  weight watchers with her husband.  She would like to try to manage blood pressure in this way until her upcoming annual physical before readding amlodipine to her medication regimen.     Return in about 4 months (around 01/08/2023) for annual physical and pap smear, fasting blood work 1 week before.    Melida Quitter, PA

## 2022-09-11 ENCOUNTER — Other Ambulatory Visit: Payer: Self-pay | Admitting: Nurse Practitioner

## 2022-09-11 DIAGNOSIS — F324 Major depressive disorder, single episode, in partial remission: Secondary | ICD-10-CM

## 2022-09-11 DIAGNOSIS — F419 Anxiety disorder, unspecified: Secondary | ICD-10-CM

## 2022-09-29 ENCOUNTER — Telehealth: Payer: Self-pay | Admitting: *Deleted

## 2022-09-29 DIAGNOSIS — I10 Essential (primary) hypertension: Secondary | ICD-10-CM

## 2022-09-29 MED ORDER — AMLODIPINE BESYLATE 5 MG PO TABS: 5.0000 mg | ORAL_TABLET | Freq: Every day | ORAL | 1 refills | Status: DC

## 2022-09-29 NOTE — Telephone Encounter (Signed)
Absolutely, we can restart the low-dose of amlodipine that she has been on in the past.  I sent the prescription for this to [facility] drug for her to get started.

## 2022-09-29 NOTE — Telephone Encounter (Signed)
Pt calling to say that she has been tracking her BPs and that they have been running in the 150s/90s and the closest to normal was 140s/upper 80s.  She said that provider mentioned possibly starting a low dose bp medicine. Please advise.

## 2022-10-15 ENCOUNTER — Other Ambulatory Visit: Payer: Self-pay | Admitting: Family Medicine

## 2022-10-15 DIAGNOSIS — Z1231 Encounter for screening mammogram for malignant neoplasm of breast: Secondary | ICD-10-CM

## 2022-10-23 ENCOUNTER — Ambulatory Visit
Admission: RE | Admit: 2022-10-23 | Discharge: 2022-10-23 | Disposition: A | Discharge status: Home / Self Care | Payer: BC Managed Care – PPO | Source: Ambulatory Visit | Attending: Family Medicine | Admitting: Family Medicine

## 2022-10-23 DIAGNOSIS — Z1231 Encounter for screening mammogram for malignant neoplasm of breast: Secondary | ICD-10-CM

## 2023-01-10 ENCOUNTER — Other Ambulatory Visit: Payer: Self-pay | Admitting: Nurse Practitioner

## 2023-01-10 DIAGNOSIS — J3089 Other allergic rhinitis: Secondary | ICD-10-CM

## 2023-01-22 ENCOUNTER — Ambulatory Visit: Payer: BC Managed Care – PPO | Admitting: Physician Assistant

## 2023-03-05 ENCOUNTER — Ambulatory Visit: Payer: BC Managed Care – PPO | Admitting: Physician Assistant

## 2023-03-05 ENCOUNTER — Encounter: Payer: Self-pay | Admitting: Physician Assistant

## 2023-03-05 ENCOUNTER — Other Ambulatory Visit (INDEPENDENT_AMBULATORY_CARE_PROVIDER_SITE_OTHER): Payer: BC Managed Care – PPO

## 2023-03-05 DIAGNOSIS — G8929 Other chronic pain: Secondary | ICD-10-CM | POA: Diagnosis not present

## 2023-03-05 DIAGNOSIS — M25511 Pain in right shoulder: Secondary | ICD-10-CM | POA: Diagnosis not present

## 2023-03-05 MED ORDER — MELOXICAM 15 MG PO TABS: 15.0000 mg | ORAL_TABLET | Freq: Every day | ORAL | 0 refills | Status: AC

## 2023-03-05 NOTE — Progress Notes (Signed)
Office Visit Note   Patient: Paige Alvarez           Date of Birth: 02-25-1966           MRN: [id number] Visit Date: 03/05/2023              Requested by: Melida Quitter, PA [address] [place],  [place] [address] PCP: Melida Quitter, PA   Assessment & Plan: Visit Diagnoses:  1. Chronic right shoulder pain     Plan: Patient is a pleasant active 57 year old woman with a complaint of right shoulder pain.  She was previously seen for this in 2018.  Unfortunately she has a bad reaction to steroid injections.  X-rays and exam today consistent with rotator cuff tendinitis.  She does have good strength.  We talked about her options.  She is trying to use her left arm more for repetitive activities such as brushing her horse.  She is willing to do some physical therapy and we talked about trying meloxicam to see if this helps her more than the ibuprofen she is taking.  Also talked about using a little Voltaren gel.  Will follow-up with me after therapy  Follow-Up Instructions: No follow-ups on file.   Orders:  Orders Placed This Encounter  Procedures   XR Shoulder Right   No orders of the defined types were placed in this encounter.     Procedures: No procedures performed   Clinical Data: No additional findings.   Subjective: Chief Complaint  Patient presents with   Right Shoulder - Pain    HPI Paige Alvarez is a pleasant 57 year old woman who comes in with a chief complaint of right shoulder pain.  She is noticed that she was more active since summer.  She takes Advil as needed does not relieve much of the pain.  She has not been using ice or heat she has had some injuries never look been looked at overhead is the worst but other movements do cause the pain to has also been more painful a couple months ago when she had a fall onto her outstretched hand Review of Systems  All other systems reviewed and are negative.    Objective: Vital Signs: There were no  vitals taken for this visit.  Physical Exam Constitutional:      Appearance: Normal appearance.  Pulmonary:     Effort: Pulmonary effort is normal.  Skin:    General: Skin is warm and dry.  Neurological:     General: No focal deficit present.     Mental Status: She is alert and oriented to person, place, and time.     Ortho Exam Exam of the right shoulder she is neurovascularly intact compartments are soft and compressible no deformities.  She has pain with overhead motion as well as internal rotation behind her back.  She has a little pain with external rotation positive impingement findings including an empty can test.  Grip strength is good and she is neurovascular intact.  Good strength with resisted abduction external and internal rotation Specialty Comments:  No specialty comments available.  Imaging: No results found.   PMFS History: Patient Active Problem List   Diagnosis Date Noted   Acute left-sided low back pain without sciatica 05/18/2022   Stress at home 04/13/2022   Body mass index 35.0-35.9, adult 12/22/2021   Achilles tendon contracture, left    Mild intermittent asthma without complication 05/16/2021   Environmental and seasonal allergies 05/16/2021  Allergic reaction 05/16/2021   Hypertension 09/27/2019   Insomnia 09/27/2019   Achilles tendon contracture, right 06/01/2017   Dysuria 07/22/2015   Microscopic hematuria 07/22/2015   GERD (gastroesophageal reflux disease) 07/17/2015   Anxiety 07/17/2015   Depression 07/17/2015   Hypercholesteremia 07/17/2015   IBS (irritable bowel syndrome) 07/17/2015   Past Medical History:  Diagnosis Date   Allergic rhinitis    Anemia    iron deficiency anemia    Anxiety    Asthma    allergy related    Depression    [employer] 09/25/2019   Diverticulosis    Family history of adverse reaction to anesthesia    pt stated that her mother had PONV   GERD (gastroesophageal reflux disease)    H/O hiatal hernia     repair with weight loss surgery    Headache    Hyperlipidemia    [employer] 09/25/2019   Hypertension    IBS (irritable bowel syndrome)    PONV (postoperative nausea and vomiting)    RLS (restless legs syndrome)    Tears of meniscus and ACL of left knee    Wears glasses    Wears glasses     Family History  Problem Relation Age of Onset   Cancer Father        prostate   COPD Father    Liver disease Mother    Asthma Mother    Depression Mother    Diabetes Mother    Hyperlipidemia Mother    Breast cancer Paternal Aunt    Hepatitis C Paternal Grandmother    Colon cancer Paternal Uncle    Cancer Maternal Grandfather     Past Surgical History:  Procedure Laterality Date   achilles lenghtening     ANTERIOR CRUCIATE LIGAMENT REPAIR Left 09/16/2018   Procedure: LEFT KNEE RECONSTRUCTION ANTERIOR CRUCIATE LIGAMENT (ACL) WITH HAMSTRING AUTOGRAFT MENISCAL REPAIR VS DEBRIDEMENT;  Surgeon: Cammy Copa, MD;  Location: [facility] OR;  Service: Orthopedics;  Laterality: Left;   CHOLECYSTECTOMY N/A 01/12/2014   Procedure: LAPAROSCOPIC CHOLECYSTECTOMY;  Surgeon: Glenna Fellows, MD;  Location: [facility] OR[facility];  Service: General;  Laterality: N/A;   COLONOSCOPY WITH ESOPHAGOGASTRODUODENOSCOPY (EGD)     gastric sleeve wtih hiatal hernia repair      GASTROCNEMIUS RECESSION Left 09/13/2021   Procedure: LEFT GASTROCNEMIUS RECESSION;  Surgeon: Nadara Mustard, MD;  Location: [facility] OR;  Service: Orthopedics;  Laterality: Left;   TONSILLECTOMY     uterine ablaton      Social History   Occupational History   Not on file  Tobacco Use   Smoking status: Never   Smokeless tobacco: Never  Vaping Use   Vaping status: Never Used  Substance and Sexual Activity   Alcohol use: Not Currently    Comment: rare   Drug use: No   Sexual activity: Yes    Birth control/protection: Surgical

## 2023-03-09 NOTE — Therapy (Deleted)
OUTPATIENT PHYSICAL THERAPY EVALUATION   Patient Name: Paige Alvarez MRN: [id number] DOB:22-Oct-1965, 57 y.o., female Today's Date: 03/09/2023  END OF SESSION:   Past Medical History:  Diagnosis Date   Allergic rhinitis    Anemia    iron deficiency anemia    Anxiety    Asthma    allergy related    Depression    Phreesia 09/25/2019   Diverticulosis    Family history of adverse reaction to anesthesia    pt stated that her mother had PONV   GERD (gastroesophageal reflux disease)    H/O hiatal hernia    repair with weight loss surgery    Headache    Hyperlipidemia    Phreesia 09/25/2019   Hypertension    IBS (irritable bowel syndrome)    PONV (postoperative nausea and vomiting)    RLS (restless legs syndrome)    Tears of meniscus and ACL of left knee    Wears glasses    Wears glasses    Past Surgical History:  Procedure Laterality Date   achilles lenghtening     ANTERIOR CRUCIATE LIGAMENT REPAIR Left 09/16/2018   Procedure: LEFT KNEE RECONSTRUCTION ANTERIOR CRUCIATE LIGAMENT (ACL) WITH HAMSTRING AUTOGRAFT MENISCAL REPAIR VS DEBRIDEMENT;  Surgeon: Cammy Copa, MD;  Location: [facility] OR;  Service: Orthopedics;  Laterality: Left;   CHOLECYSTECTOMY N/A 01/12/2014   Procedure: LAPAROSCOPIC CHOLECYSTECTOMY;  Surgeon: Glenna Fellows, MD;  Location: [facility];  Service: General;  Laterality: N/A;   COLONOSCOPY WITH ESOPHAGOGASTRODUODENOSCOPY (EGD)     gastric sleeve wtih hiatal hernia repair      GASTROCNEMIUS RECESSION Left 09/13/2021   Procedure: LEFT GASTROCNEMIUS RECESSION;  Surgeon: Nadara Mustard, MD;  Location: [facility] OR;  Service: Orthopedics;  Laterality: Left;   TONSILLECTOMY     uterine ablaton      Patient Active Problem List   Diagnosis Date Noted   Acute left-sided low back pain without sciatica 05/18/2022   Stress at home 04/13/2022   Body mass index 35.0-35.9, adult 12/22/2021   Achilles tendon contracture, left    Mild intermittent asthma without  complication 05/16/2021   Environmental and seasonal allergies 05/16/2021   Allergic reaction 05/16/2021   Hypertension 09/27/2019   Insomnia 09/27/2019   Achilles tendon contracture, right 06/01/2017   Dysuria 07/22/2015   Microscopic hematuria 07/22/2015   GERD (gastroesophageal reflux disease) 07/17/2015   Anxiety 07/17/2015   Depression 07/17/2015   Hypercholesteremia 07/17/2015   IBS (irritable bowel syndrome) 07/17/2015    PCP:  Melida Quitter, PA  REFERRING PROVIDER: Persons, [place], PA  REFERRING DIAG: chronic right shoulder pain  THERAPY DIAG:  No diagnosis found.  Rationale for Evaluation and Treatment: Rehabilitation  ONSET DATE: ***  SUBJECTIVE:  SUBJECTIVE STATEMENT: *** Hand dominance: {MISC; OT HAND DOMINANCE:[phone]}  Has horses  Had FOOSH 2(?) Months ago  Ortho PA feles it matches R RTC tendinitis    PERTINENT HISTORY: Patient is a 57 y.o. female who presents to outpatient physical therapy with a referral for medical diagnosis chronic right shoulder pain. This patient's chief complaints consist of ***, leading to the following functional deficits: ***. Relevant past medical history and comorbidities include has GERD (gastroesophageal reflux disease); Anxiety; Depression; Hypercholesteremia; IBS (irritable bowel syndrome); Dysuria; Microscopic hematuria; Achilles tendon contracture, right; Hypertension; Insomnia; Mild intermittent asthma without complication; Environmental and seasonal allergies; Allergic reaction; Achilles tendon contracture, left; Body mass index 35.0-35.9, adult; Stress at home; and Acute left-sided low back pain without sciatica on their problem list.  has a past medical history of Allergic rhinitis, Anemia,  Diverticulosis, Family history of adverse  reaction to anesthesia, H/O hiatal hernia, Headache, Hyperlipidemia, Hypertension,  RLS (restless legs syndrome), Tears of meniscus and ACL of left knee, Wears glasses.  has a past surgical history that includes Tonsillectomy; uterine ablaton ; gastric sleeve wtih hiatal hernia repair ; Cholecystectomy (N/A, 01/12/2014); achilles lenghtening; Colonoscopy with esophagogastroduodenoscopy (egd); Anterior cruciate ligament repair (Left, 09/16/2018); and Gastrocnemius Recession (Left, 09/13/2021). Patient denies hx of {redflags:27294}   PAIN: Are you having pain? Yes NPRS: Current: ***/10,  Best: ***/10, Worst: ***/10. Pain location: *** Pain description: *** Aggravating factors: *** Relieving factors: ***   FUNCTIONAL LIMITATIONS: ***  LEISURE: ***  PRECAUTIONS: {Therapy precautions:24002}  WEIGHT BEARING RESTRICTIONS: {Yes ***/No:24003}  FALLS:  Has patient fallen in last 6 months? {fallsyesno:27318}  LIVING ENVIRONMENT: Lives with: {[facility] lives with:25569::"lives with their family"} Lives in: {Lives in:25570} Stairs: {opstairs:27293} Has following equipment at home: {Assistive devices:23999}  OCCUPATION: ***  PLOF: {PLOF:24004}  PATIENT GOALS:***  NEXT MD VISIT:   OBJECTIVE  DIAGNOSTIC FINDINGS:  Xray report 03/05/2023 of right shoulder "Graphs of her right shoulder demonstrate no evidence of fracture or dislocation.  She does have some degenerative changes and narrowing at the [facility] joint"  SELF- REPORTED FUNCTION FOTO score: ***/100 (shoulder questionnaire)  OBSERVATION/INSPECTION Posture Posture (seated): forward head, rounded shoulders, slumped in sitting.  Posture (standing): *** Posture correction: *** Anthropometrics Tremor: none Body composition: *** Muscle bulk: *** Skin: The incision sites appear to be healing well with no excessive redness, warmth, drainage or signs of infection present.  *** Edema: *** Functional Mobility Bed mobility: *** Transfers:  *** Gait: grossly WFL for household and short community ambulation. More detailed gait analysis deferred to later date as needed. *** Stairs: ***  SPINE MOTION  LUMBAR SPINE AROM *Indicates pain Flexion: *** Extension: *** Side Flexion:   R ***  L *** Rotation:  R *** L *** Side glide:  R *** L ***  NEUROLOGICAL  Upper Motor Neuron Screen Babinski, Hoffman's and Clonus (ankle) negative bilaterally.  Dermatomes C2-T1 appears equal and intact to light touch except the following: *** L2-S2 appears equal and intact to light touch except the following: *** Deep Tendon Reflexes R/L  ***+/***+ Biceps brachii reflex (C5, C6) ***+/***+ Brachioradialis reflex (C6) ***+/***+ Triceps brachii reflex (C7) ***+/***+ Quadriceps reflex (L4) ***+/***+ Achilles reflex (S1)  SPINE MOTION  CERVICAL SPINE AROM *Indicates pain Flexion: *** Extension: *** Side Flexion:   R ***  L *** Rotation:  R *** L *** Protraction: *** Retrusion: ***   PERIPHERAL JOINT MOTION (in degrees)  ACTIVE RANGE OF MOTION (AROM) *Indicates pain Date Date Date  Joint/Motion R/L R/L R/L  Shoulder  Flexion / / /  Extension / / /  Abduction  / / /  External rotation / / /  Internal rotation / / /  Elbow     Flexion  / / /  Extension  / / /  Wrist     Flexion / / /  Extension  / / /  Radial deviation / / /  Ulnar deviation / / /  Pronation / / /  Supination / / /  Comments:   PASSIVE RANGE OF MOTION (PROM) *Indicates pain Date Date Date  Joint/Motion R/L R/L R/L  Shoulder     Flexion / / /  Extension / / /  Abduction  / / /  External rotation / / /  Internal rotation / / /  Elbow     Flexion  / / /  Extension  / / /  Wrist     Flexion / / /  Extension  / / /  Radial deviation / / /  Ulnar deviation / / /  Pronation / / /  Supination / / /  Comments:   MUSCLE PERFORMANCE (MMT):  *Indicates pain Date Date Date  Joint/Motion R/L R/L R/L  Shoulder     Flexion / / /   Abduction (C5) / / /  External rotation / / /  Internal rotation / / /  Extension / / /  Elbow     Flexion (C6) / / /  Extension (C7) / / /  Wrist     Flexion (C7) / / /  Extension (C6) / / /  Radial deviation / / /  Ulnar deviation (C8) / / /  Pronation / / /  Supination / / /  Hand     Thumb extension (C8) / / /  Finger abduction (T1) / / /  Grip (C8) / / /  Comments:   SPECIAL TESTS:  .Neurodynamictests .NeurodynamicUE .NeurodynamicLE .CspineInstability .CSPINESPECIALTESTS .SHOULDERSPECIALTESTCLUSTERS .HIPSPECIALTESTS .SIJSPECIALTESTS   SHOULDER SPECIAL TESTS RTC, Impingement, Anterior Instability (macrotrauma), Labral Tear: Painful arc test: R = ***, L = ***. Drop arm test: R = ***, L = ***. Hawkins-Kennedy test: R = ***, L = ***. Infraspinatus test: R = ***, L = ***. Apprehension test: R = ***, L = ***. Relocation test: R = ***, L = ***. Active compression test: R = ***, L = ***.  ACCESSORY MOTION: ***  PALPATION: ***  SUSTAINED POSITIONS TESTING:  ***  REPEATED MOTIONS TESTING: ***  FUNCTIONAL/BALANCE TESTS: Five Time Sit to Stand (5TSTS): *** seconds Functional Gait Assessment (FGA): ***/30 (see details above) Ten meter walking trial ( ): *** m/s Six Minute Walk Test ( ): *** feet Timed Up and Go (TUG): *** seconds   Dynamic Gait Index: ***/24 BERG Balance Scale: ***/56 Tinetti/POMA: ***/28 Timed Up and GO: *** seconds (average of 3 trials) Trial 1: *** Trial 2: *** Trial 3: *** Romberg test: -Narrow stance, eyes open: *** seconds -Narrow stance, eyes closed: *** seconds Sharpened Romberg test: -Tandem stance, eyes open: *** seconds -Tandem stance, eyes closed: *** seconds  Narrow stance, firm surface, eyes open: *** seconds Narrow stance, firm surface, eyes closed: *** seconds Narrow stance, compliant surface, eyes open: *** seconds Narrow stance, compliant surface, eyes closed: *** seconds Single leg stance, firm  surface, eyes open: R= *** seconds, L= *** seconds Single leg stance, compliant surface, eyes open: R= *** seconds, L= *** seconds Gait speed: *** m/s Functional reach test: *** inches  TODAY'S TREATMENT:   PATIENT EDUCATION: Education details: *** Person educated: {Person educated:25204} Education method: {Education Method:25205} Education comprehension: {Education Comprehension:25206}  HOME EXERCISE PROGRAM: ***  ASSESSMENT:  CLINICAL IMPRESSION: Patient is a 57 y.o. female referred to outpatient physical therapy with a medical diagnosis of chronic right shoulder pain who presents with signs and symptoms consistent with ***. Patient presents with significant *** impairments that are limiting ability to complete *** without difficulty. Patient will benefit from skilled physical therapy intervention to address current body structure impairments and activity limitations to improve function and work towards goals set in current POC in order to return to prior level of function or maximal functional improvement.    OBJECTIVE IMPAIRMENTS: {opptimpairments:25111}.   ACTIVITY LIMITATIONS: {activitylimitations:27494}  PARTICIPATION LIMITATIONS: {participationrestrictions:25113}  PERSONAL FACTORS: {Personal factors:25162} are also affecting patient's functional outcome.   REHAB POTENTIAL: {rehabpotential:25112}  CLINICAL DECISION MAKING: {clinical decision making:25114}  EVALUATION COMPLEXITY: {Evaluation complexity:25115}   GOALS: Goals reviewed with patient? {yes/no:20286}  SHORT TERM GOALS: Target date: 03/23/2023  Patient will be independent with initial home exercise program for self-management of symptoms. Baseline: {HEPbaseline4:27310} (03/09/23); Goal status: INITIAL   LONG TERM GOALS: Target date: 06/01/2023  Patient will be independent with a long-term home exercise program for self-management of symptoms.  Baseline: {HEPbaseline4:27310} (03/09/23); Goal  status: INITIAL  2.  Patient will demonstrate improved FOTO to equal or greater than *** by visit #*** to demonstrate improvement in overall condition and self-reported functional ability.  Baseline: *** (03/09/23); Goal status: INITIAL  3.  *** Baseline: *** (03/09/23); Goal status: INITIAL  4.  *** Baseline: *** (03/09/23); Goal status: INITIAL  5.  Patient will demonstrate improvement in Patient Specific Functional Scale (PSFS) of equal or greater than 3 points to reflect clinically significant improvement in patient's most valued functional activities. Baseline: *** (03/09/23); Goal status: INITIAL  6.  Patient will complete community, work and/or recreational activities without limitation due to current condition.  Baseline: *** (03/09/23); Goal status: INITIAL   PLAN:  PT FREQUENCY: 1-2x/week  PT DURATION: 12 weeks  PLANNED INTERVENTIONS: {rehab planned interventions:25118::"97110-Therapeutic exercises","97530- Therapeutic [phone]- Neuromuscular re-education","97535- Self ONGE","95284- Manual therapy"}  PLAN FOR NEXT SESSION: ***   Gerhart Ruggieri R. Ilsa Iha, PT, DPT 03/09/23, 3:00 PM  [facility] [address] [place], [place] [address] P: [phone] I F: [phone]

## 2023-03-10 ENCOUNTER — Ambulatory Visit: Payer: Self-pay | Admitting: Physical Therapy

## 2023-03-11 NOTE — Therapy (Deleted)
OUTPATIENT PHYSICAL THERAPY EVALUATION   Patient Name: ALEXANDER CRITCHLEY MRN: [id number] DOB:11-06-1965, 57 y.o., female Today's Date: 03/11/2023  END OF SESSION:   Past Medical History:  Diagnosis Date   Allergic rhinitis    Anemia    iron deficiency anemia    Anxiety    Asthma    allergy related    Depression    Phreesia 09/25/2019   Diverticulosis    Family history of adverse reaction to anesthesia    pt stated that her mother had PONV   GERD (gastroesophageal reflux disease)    H/O hiatal hernia    repair with weight loss surgery    Headache    Hyperlipidemia    Phreesia 09/25/2019   Hypertension    IBS (irritable bowel syndrome)    PONV (postoperative nausea and vomiting)    RLS (restless legs syndrome)    Tears of meniscus and ACL of left knee    Wears glasses    Wears glasses    Past Surgical History:  Procedure Laterality Date   achilles lenghtening     ANTERIOR CRUCIATE LIGAMENT REPAIR Left 09/16/2018   Procedure: LEFT KNEE RECONSTRUCTION ANTERIOR CRUCIATE LIGAMENT (ACL) WITH HAMSTRING AUTOGRAFT MENISCAL REPAIR VS DEBRIDEMENT;  Surgeon: Cammy Copa, MD;  Location: [facility] OR;  Service: Orthopedics;  Laterality: Left;   CHOLECYSTECTOMY N/A 01/12/2014   Procedure: LAPAROSCOPIC CHOLECYSTECTOMY;  Surgeon: Glenna Fellows, MD;  Location: [facility];  Service: General;  Laterality: N/A;   COLONOSCOPY WITH ESOPHAGOGASTRODUODENOSCOPY (EGD)     gastric sleeve wtih hiatal hernia repair      GASTROCNEMIUS RECESSION Left 09/13/2021   Procedure: LEFT GASTROCNEMIUS RECESSION;  Surgeon: Nadara Mustard, MD;  Location: [facility] OR;  Service: Orthopedics;  Laterality: Left;   TONSILLECTOMY     uterine ablaton      Patient Active Problem List   Diagnosis Date Noted   Acute left-sided low back pain without sciatica 05/18/2022   Stress at home 04/13/2022   Body mass index 35.0-35.9, adult 12/22/2021   Achilles tendon contracture, left    Mild intermittent asthma without  complication 05/16/2021   Environmental and seasonal allergies 05/16/2021   Allergic reaction 05/16/2021   Hypertension 09/27/2019   Insomnia 09/27/2019   Achilles tendon contracture, right 06/01/2017   Dysuria 07/22/2015   Microscopic hematuria 07/22/2015   GERD (gastroesophageal reflux disease) 07/17/2015   Anxiety 07/17/2015   Depression 07/17/2015   Hypercholesteremia 07/17/2015   IBS (irritable bowel syndrome) 07/17/2015    PCP:  Melida Quitter, PA  REFERRING PROVIDER: Persons, [place], PA  REFERRING DIAG: chronic right shoulder pain  THERAPY DIAG:  No diagnosis found.  Rationale for Evaluation and Treatment: Rehabilitation  ONSET DATE: ***  SUBJECTIVE:  SUBJECTIVE STATEMENT: *** Hand dominance: {MISC; OT HAND [phone]}  Has horses  Had FOOSH 2(?) Months ago  Ortho PA feles it matches R RTC tendinitis    PERTINENT HISTORY: Patient is a 57 y.o. female who presents to outpatient physical therapy with a referral for medical diagnosis chronic right shoulder pain. This patient's chief complaints consist of ***, leading to the following functional deficits: ***. Relevant past medical history and comorbidities include has GERD (gastroesophageal reflux disease); Anxiety; Depression; Hypercholesteremia; IBS (irritable bowel syndrome); Dysuria; Microscopic hematuria; Achilles tendon contracture, right; Hypertension; Insomnia; Mild intermittent asthma without complication; Environmental and seasonal allergies; Allergic reaction; Achilles tendon contracture, left; Body mass index 35.0-35.9, adult; Stress at home; and Acute left-sided low back pain without sciatica on their problem list.  has a past medical history of Allergic rhinitis, Anemia,  Diverticulosis, Family history of adverse  reaction to anesthesia, H/O hiatal hernia, Headache, Hyperlipidemia, Hypertension,  RLS (restless legs syndrome), Tears of meniscus and ACL of left knee, Wears glasses.  has a past surgical history that includes Tonsillectomy; uterine ablaton ; gastric sleeve wtih hiatal hernia repair ; Cholecystectomy (N/A, 01/12/2014); achilles lenghtening; Colonoscopy with esophagogastroduodenoscopy (egd); Anterior cruciate ligament repair (Left, 09/16/2018); and Gastrocnemius Recession (Left, 09/13/2021). Patient denies hx of {redflags:27294}   PAIN: Are you having pain? Yes NPRS: Current: ***/10,  Best: ***/10, Worst: ***/10. Pain location: *** Pain description: *** Aggravating factors: *** Relieving factors: ***   FUNCTIONAL LIMITATIONS: ***  LEISURE: ***  PRECAUTIONS: {Therapy precautions:24002}  WEIGHT BEARING RESTRICTIONS: {Yes ***/No:24003}  FALLS:  Has patient fallen in last 6 months? {fallsyesno:27318}  LIVING ENVIRONMENT: Lives with: {OPRC lives with:25569::"lives with their family"} Lives in: {Lives in:25570} Stairs: {opstairs:27293} Has following equipment at home: {Assistive devices:23999}  OCCUPATION: ***  PLOF: {PLOF:24004}  PATIENT GOALS:***  NEXT MD VISIT:   OBJECTIVE  DIAGNOSTIC FINDINGS:  Xray report 03/05/2023 of right shoulder "Graphs of her right shoulder demonstrate no evidence of fracture or dislocation.  She does have some degenerative changes and narrowing at the [facility] joint"  SELF- REPORTED FUNCTION FOTO score: ***/100 (shoulder questionnaire)  OBSERVATION/INSPECTION Posture Posture (seated): forward head, rounded shoulders, slumped in sitting.  Posture (standing): *** Posture correction: *** Anthropometrics Tremor: none Body composition: *** Muscle bulk: *** Skin: The incision sites appear to be healing well with no excessive redness, warmth, drainage or signs of infection present.  *** Edema: *** Functional Mobility Bed mobility: *** Transfers:  *** Gait: grossly WFL for household and short community ambulation. More detailed gait analysis deferred to later date as needed. *** Stairs: ***  SPINE MOTION  LUMBAR SPINE AROM *Indicates pain Flexion: *** Extension: *** Side Flexion:   R ***  L *** Rotation:  R *** L *** Side glide:  R *** L ***  NEUROLOGICAL  Upper Motor Neuron Screen Babinski, Hoffman's and Clonus (ankle) negative bilaterally.  Dermatomes C2-T1 appears equal and intact to light touch except the following: *** L2-S2 appears equal and intact to light touch except the following: *** Deep Tendon Reflexes R/L  ***+/***+ Biceps brachii reflex (C5, C6) ***+/***+ Brachioradialis reflex (C6) ***+/***+ Triceps brachii reflex (C7) ***+/***+ Quadriceps reflex (L4) ***+/***+ Achilles reflex (S1)  SPINE MOTION  CERVICAL SPINE AROM *Indicates pain Flexion: *** Extension: *** Side Flexion:   R ***  L *** Rotation:  R *** L *** Protraction: *** Retrusion: ***   PERIPHERAL JOINT MOTION (in degrees)  ACTIVE RANGE OF MOTION (AROM) *Indicates pain Date Date Date  Joint/Motion R/L R/L R/L  Shoulder  Flexion / / /  Extension / / /  Abduction  / / /  External rotation / / /  Internal rotation / / /  Elbow     Flexion  / / /  Extension  / / /  Wrist     Flexion / / /  Extension  / / /  Radial deviation / / /  Ulnar deviation / / /  Pronation / / /  Supination / / /  Comments:   PASSIVE RANGE OF MOTION (PROM) *Indicates pain Date Date Date  Joint/Motion R/L R/L R/L  Shoulder     Flexion / / /  Extension / / /  Abduction  / / /  External rotation / / /  Internal rotation / / /  Elbow     Flexion  / / /  Extension  / / /  Wrist     Flexion / / /  Extension  / / /  Radial deviation / / /  Ulnar deviation / / /  Pronation / / /  Supination / / /  Comments:   MUSCLE PERFORMANCE (MMT):  *Indicates pain Date Date Date  Joint/Motion R/L R/L R/L  Shoulder     Flexion / / /   Abduction (C5) / / /  External rotation / / /  Internal rotation / / /  Extension / / /  Elbow     Flexion (C6) / / /  Extension (C7) / / /  Wrist     Flexion (C7) / / /  Extension (C6) / / /  Radial deviation / / /  Ulnar deviation (C8) / / /  Pronation / / /  Supination / / /  Hand     Thumb extension (C8) / / /  Finger abduction (T1) / / /  Grip (C8) / / /  Comments:   SPECIAL TESTS:  .Neurodynamictests .NeurodynamicUE .NeurodynamicLE .CspineInstability .CSPINESPECIALTESTS .SHOULDERSPECIALTESTCLUSTERS .HIPSPECIALTESTS .SIJSPECIALTESTS   SHOULDER SPECIAL TESTS RTC, Impingement, Anterior Instability (macrotrauma), Labral Tear: Painful arc test: R = ***, L = ***. Drop arm test: R = ***, L = ***. Hawkins-Kennedy test: R = ***, L = ***. Infraspinatus test: R = ***, L = ***. Apprehension test: R = ***, L = ***. Relocation test: R = ***, L = ***. Active compression test: R = ***, L = ***.  ACCESSORY MOTION: ***  PALPATION: ***  SUSTAINED POSITIONS TESTING:  ***  REPEATED MOTIONS TESTING: ***  FUNCTIONAL/BALANCE TESTS: Five Time Sit to Stand (5TSTS): *** seconds Functional Gait Assessment (FGA): ***/30 (see details above) Ten meter walking trial ( ): *** m/s Six Minute Walk Test ( ): *** feet Timed Up and Go (TUG): *** seconds   Dynamic Gait Index: ***/24 BERG Balance Scale: ***/56 Tinetti/POMA: ***/28 Timed Up and GO: *** seconds (average of 3 trials) Trial 1: *** Trial 2: *** Trial 3: *** Romberg test: -Narrow stance, eyes open: *** seconds -Narrow stance, eyes closed: *** seconds Sharpened Romberg test: -Tandem stance, eyes open: *** seconds -Tandem stance, eyes closed: *** seconds  Narrow stance, firm surface, eyes open: *** seconds Narrow stance, firm surface, eyes closed: *** seconds Narrow stance, compliant surface, eyes open: *** seconds Narrow stance, compliant surface, eyes closed: *** seconds Single leg stance, firm  surface, eyes open: R= *** seconds, L= *** seconds Single leg stance, compliant surface, eyes open: R= *** seconds, L= *** seconds Gait speed: *** m/s Functional reach test: *** inches  TODAY'S TREATMENT:   PATIENT EDUCATION: Education details: *** Person educated: {Person educated:25204} Education method: {Education Method:25205} Education comprehension: {Education Comprehension:25206}  HOME EXERCISE PROGRAM: ***  ASSESSMENT:  CLINICAL IMPRESSION: Patient is a 57 y.o. female referred to outpatient physical therapy with a medical diagnosis of chronic right shoulder pain who presents with signs and symptoms consistent with ***. Patient presents with significant *** impairments that are limiting ability to complete *** without difficulty. Patient will benefit from skilled physical therapy intervention to address current body structure impairments and activity limitations to improve function and work towards goals set in current POC in order to return to prior level of function or maximal functional improvement.    OBJECTIVE IMPAIRMENTS: {opptimpairments:25111}.   ACTIVITY LIMITATIONS: {activitylimitations:27494}  PARTICIPATION LIMITATIONS: {participationrestrictions:25113}  PERSONAL FACTORS: {Personal factors:25162} are also affecting patient's functional outcome.   REHAB POTENTIAL: {rehabpotential:25112}  CLINICAL DECISION MAKING: {clinical decision making:25114}  EVALUATION COMPLEXITY: {Evaluation complexity:25115}   GOALS: Goals reviewed with patient? {yes/no:20286}  SHORT TERM GOALS: Target date: 03/25/2023  Patient will be independent with initial home exercise program for self-management of symptoms. Baseline: {HEPbaseline4:27310} (03/11/23); Goal status: INITIAL   LONG TERM GOALS: Target date: 06/03/2023  Patient will be independent with a long-term home exercise program for self-management of symptoms.  Baseline: {HEPbaseline4:27310} (03/11/23); Goal  status: INITIAL  2.  Patient will demonstrate improved FOTO to equal or greater than *** by visit #*** to demonstrate improvement in overall condition and self-reported functional ability.  Baseline: *** (03/11/23); Goal status: INITIAL  3.  *** Baseline: *** (03/11/23); Goal status: INITIAL  4.  *** Baseline: *** (03/11/23); Goal status: INITIAL  5.  Patient will demonstrate improvement in Patient Specific Functional Scale (PSFS) of equal or greater than 3 points to reflect clinically significant improvement in patient's most valued functional activities. Baseline: *** (03/11/23); Goal status: INITIAL  6.  Patient will complete community, work and/or recreational activities without limitation due to current condition.  Baseline: *** (03/11/23); Goal status: INITIAL   PLAN:  PT FREQUENCY: 1-2x/week  PT DURATION: 12 weeks  PLANNED INTERVENTIONS: {rehab planned interventions:25118::"97110-Therapeutic exercises","97530- Therapeutic [phone]- Neuromuscular re-education","97535- Self FAOZ","30865- Manual therapy"}  PLAN FOR NEXT SESSION: ***   Ethin Drummond R. Ilsa Iha, PT, DPT 03/11/23, 9:39 AM  [facility] [address] [place], [place] [address] P: [phone] I F: [phone]

## 2023-03-12 ENCOUNTER — Ambulatory Visit: Payer: BC Managed Care – PPO | Admitting: Physical Therapy

## 2023-03-12 ENCOUNTER — Ambulatory Visit: Payer: Self-pay | Admitting: Physical Therapy

## 2023-03-23 ENCOUNTER — Encounter: Payer: BC Managed Care – PPO | Admitting: Physical Therapy

## 2023-03-26 ENCOUNTER — Encounter: Payer: BC Managed Care – PPO | Admitting: Physical Therapy

## 2023-03-30 ENCOUNTER — Encounter: Payer: BC Managed Care – PPO | Admitting: Physical Therapy

## 2023-04-01 ENCOUNTER — Encounter: Payer: BC Managed Care – PPO | Admitting: Physical Therapy

## 2023-04-02 ENCOUNTER — Ambulatory Visit: Payer: BC Managed Care – PPO | Admitting: Physician Assistant

## 2023-04-06 ENCOUNTER — Encounter: Payer: BC Managed Care – PPO | Admitting: Physical Therapy

## 2023-04-08 ENCOUNTER — Encounter: Payer: BC Managed Care – PPO | Admitting: Physical Therapy

## 2023-04-13 ENCOUNTER — Encounter: Payer: BC Managed Care – PPO | Admitting: Physical Therapy

## 2023-04-15 ENCOUNTER — Encounter: Payer: BC Managed Care – PPO | Admitting: Physical Therapy

## 2023-04-20 ENCOUNTER — Encounter: Payer: BC Managed Care – PPO | Admitting: Physical Therapy

## 2023-04-22 ENCOUNTER — Encounter: Payer: BC Managed Care – PPO | Admitting: Physical Therapy

## 2023-04-24 ENCOUNTER — Other Ambulatory Visit: Payer: Self-pay | Admitting: Family Medicine

## 2023-04-24 DIAGNOSIS — F419 Anxiety disorder, unspecified: Secondary | ICD-10-CM

## 2023-04-24 DIAGNOSIS — F324 Major depressive disorder, single episode, in partial remission: Secondary | ICD-10-CM

## 2023-04-24 DIAGNOSIS — I10 Essential (primary) hypertension: Secondary | ICD-10-CM

## 2023-04-27 ENCOUNTER — Encounter: Payer: BC Managed Care – PPO | Admitting: Physical Therapy

## 2023-04-29 ENCOUNTER — Encounter: Payer: BC Managed Care – PPO | Admitting: Physical Therapy

## 2023-04-30 ENCOUNTER — Encounter: Payer: Self-pay | Admitting: Family Medicine

## 2023-05-06 ENCOUNTER — Encounter: Payer: Self-pay | Admitting: Family Medicine

## 2023-05-06 ENCOUNTER — Ambulatory Visit: Payer: 59 | Admitting: Family Medicine

## 2023-05-06 VITALS — BP 138/78 | HR 70 | Ht 63.5 in | Wt 214.8 lb

## 2023-05-06 DIAGNOSIS — L03012 Cellulitis of left finger: Secondary | ICD-10-CM | POA: Diagnosis not present

## 2023-05-06 MED ORDER — MUPIROCIN 2 % EX OINT: TOPICAL_OINTMENT | CUTANEOUS | 3 refills | Status: AC

## 2023-05-06 NOTE — Progress Notes (Signed)
   Acute Office Visit  Subjective:     Patient ID: Paige Alvarez, female    DOB: 1965-07-29, 58 y.o.   MRN: [id number]  Chief Complaint  Patient presents with   Finger Problem    HPI Patient is in today for left finger problem.  She initially developed a blister around the fingernail of the third digit of her left hand about 3 weeks ago.  She popped the blister, and it then started to develop erythema and what she believed was infection.  She has been using Neosporin and soaking in salt water baths which has provided significant improvement in symptoms.  ROS See HPI    Objective:    BP (!) 140/88   Pulse 70   Ht 5' 3.5" (1.613 m)   Wt 214 lb 12 oz (97.4 kg)   SpO2 98%   BMI 37.44 kg/m   Physical Exam Constitutional:      General: She is not in acute distress.    Appearance: Normal appearance.  HENT:     Head: Normocephalic and atraumatic.  Pulmonary:     Effort: Pulmonary effort is normal. No respiratory distress.  Musculoskeletal:     Cervical back: Normal range of motion.  Skin:    Findings: Erythema (Nail fold of third digit of left hand) present.  Neurological:     General: No focal deficit present.     Mental Status: She is alert and oriented to person, place, and time. Mental status is at baseline.  Psychiatric:        Mood and Affect: Mood normal.        Thought Content: Thought content normal.        Judgment: Judgment normal.       Assessment & Plan:  Paronychia of finger of left hand -     Mupirocin; Apply to affected area TID for 7 days.  Dispense: 30 g; Refill: 3  Continue with salt water baths and keeping the wound clean and dry in between.  Prescribing mupirocin to use topically for 7 days, or for 2 days after erythema resolves.  Patient verbalized understanding and is agreeable to this plan.  Return if symptoms worsen or fail to improve.  Melida Quitter, PA

## 2023-05-06 NOTE — Patient Instructions (Signed)
Paronychia Care Keep the affected area clean. Soak the fingers or toes in warm water as told by your doctor. You may be told to do this for 20 minutes, 2-3 times a day. Keep the area dry when you are not soaking it. Do not try to drain a fluid-filled bump on your own. Follow instructions from your doctor about how to take care of the affected area. Make sure you: Wash your hands with soap and water for at least 20 seconds before and after you change your bandage. If you cannot use soap and water, use hand sanitizer. Change your bandage as told by your doctor. If you had a fluid-filled bump and your doctor drained it, check the area every day for signs of infection. Check for: Redness, swelling, or pain. Fluid or blood. Warmth. Pus or a bad smell.

## 2023-06-16 ENCOUNTER — Ambulatory Visit: Payer: Self-pay

## 2023-06-16 NOTE — Telephone Encounter (Signed)
  Chief Complaint: Rectal bleed Symptoms: blood during bowel movement Frequency: each bowel movement for 24 hours Pertinent Negatives: Patient denies dizziness, pain, bleeding when not having BM.  Disposition: [] ED /[] Urgent Care (no appt availability in office) / [x] Appointment(In office/virtual)/ []  [facility]/ [] Home Care/ [] Refused Recommended Disposition /[] [facility]/ []  Follow-up with PCP Additional Notes:  Dx with IBS. She is calling today for rectal bleed. She is having bright red blood during bowel movement, this has been going for 24 hours. Bright red blood in toilet water and on paper, stool looks normal color and size.  No other symptoms. She manages her IBS with Miralax with effect. Advised PCP follow up within 3 days, patient requested female provider. Acute visit scheduled at another location on 06/19/23 with female provider.  Educated on care advice as documented in protocol, patient verbalized understanding. Discussed reasons to call back or call for EMS.    Copied from CRM [id number]. Topic: Clinical - Red Word Triage >> Jun 16, 2023  4:51 PM Louie Casa B wrote: [facility] that prompted transfer to Nurse Triage: rectal bleeding Reason for Disposition  MILD rectal bleeding (more than just a few drops or streaks)  Protocols used: Rectal Bleeding-A-AH

## 2023-06-19 ENCOUNTER — Ambulatory Visit: Admitting: Family Medicine

## 2023-06-19 ENCOUNTER — Encounter: Payer: Self-pay | Admitting: Family Medicine

## 2023-06-19 VITALS — BP 124/70 | HR 92 | Temp 98.0°F | Ht 63.5 in | Wt 216.4 lb

## 2023-06-19 DIAGNOSIS — K625 Hemorrhage of anus and rectum: Secondary | ICD-10-CM

## 2023-06-19 DIAGNOSIS — K648 Other hemorrhoids: Secondary | ICD-10-CM

## 2023-06-19 MED ORDER — HYDROCORTISONE ACETATE 25 MG RE SUPP: 25.0000 mg | Freq: Every day | RECTAL | 0 refills | Status: AC

## 2023-06-19 NOTE — Progress Notes (Signed)
 Patient ID: Paige Alvarez, female    DOB: 21-Oct-1965, 58 y.o.   MRN: [id number]  This visit was conducted in person.  BP 124/70 (BP Location: Right Arm, Patient Position: Sitting, Cuff Size: Large)   Pulse 92   Temp 98 F (36.7 C) (Temporal)   Ht 5' 3.5" (1.613 m)   Wt 216 lb 6 oz (98.1 kg)   SpO2 95%   BMI 37.73 kg/m    CC:  Chief Complaint  Patient presents with   Rectal Bleeding    Subjective:   HPI: Paige Alvarez is a 58 y.o. female  patient of Saralyn Pilar , [place] with history of   IBS- C ( treated with Miralax), GERD presenting on 06/19/2023 for Rectal Bleeding   She reports new onset brbpr with BM... associated with some straining.. first noted off and 2 months ago, now in lastr week.. noted 2 day of brbpr every BM. None in last 2 days.... noted blood on toilet and drips in toilet.  No pain with BMs, no ractal soreness. No abdominal pain.  No fever.  No vomiting.   Colonoscopy 2017 showed int hemorrhoids.    Takes miralax once daily... having daily BM.  Relevant past medical, surgical, family and social history reviewed and updated as indicated. Interim medical history since our last visit reviewed. Allergies and medications reviewed and updated. Outpatient Medications Prior to Visit  Medication Sig Dispense Refill   amLODipine (NORVASC) 5 MG tablet TAKE 1 TABLET (5 MG TOTAL) BY MOUTH DAILY. 90 tablet 2   Biotin 40981 MCG TABS Take 10,000 mcg by mouth daily.     busPIRone (BUSPAR) 10 MG tablet TAKE 1 TABLET BY MOUTH 2 TIMES DAILY AS NEEDED. 60 tablet 3   EPINEPHrine (EPIPEN 2-PAK) 0.3 mg/0.3 mL IJ SOAJ injection Inject 0.3 mg into the muscle as needed for anaphylaxis. 1 each 1   fexofenadine (ALLEGRA) 180 MG tablet Take 180 mg by mouth daily.      FLUoxetine (PROZAC) 20 MG tablet Take 1 tablet (20 mg total) by mouth daily. 90 tablet 3   fluticasone (FLONASE) 50 MCG/ACT nasal spray Place 1 spray into both nostrils daily as needed for allergies or rhinitis.      meloxicam (MOBIC) 15 MG tablet Take 1 tablet (15 mg total) by mouth daily. 30 tablet 0   montelukast (SINGULAIR) 10 MG tablet TAKE 1 TABLET (10 MG TOTAL) BY MOUTH DAILY. 90 tablet 33   mupirocin ointment (BACTROBAN) 2 % Apply to affected area TID for 7 days. 30 g 3   tiZANidine (ZANAFLEX) 4 MG tablet Take 0.5 to 1 tablet po QHS prn muscle pain 30 tablet 1   triamcinolone cream (KENALOG) 0.1 % Apply 1 Application topically 2 (two) times daily as needed (eczema).     No facility-administered medications prior to visit.     Per HPI unless specifically indicated in ROS section below Review of Systems  Constitutional:  Negative for fatigue and fever.  HENT:  Negative for congestion.   Eyes:  Negative for pain.  Respiratory:  Negative for cough and shortness of breath.   Cardiovascular:  Negative for chest pain, palpitations and leg swelling.  Gastrointestinal:  Negative for abdominal pain.  Genitourinary:  Negative for dysuria and vaginal bleeding.  Musculoskeletal:  Negative for back pain.  Neurological:  Negative for syncope, light-headedness and headaches.  Psychiatric/Behavioral:  Negative for dysphoric mood.    Objective:  BP 124/70 (BP Location: Right Arm, Patient Position: Sitting, Cuff  Size: Large)   Pulse 92   Temp 98 F (36.7 C) (Temporal)   Ht 5' 3.5" (1.613 m)   Wt 216 lb 6 oz (98.1 kg)   SpO2 95%   BMI 37.73 kg/m   Wt Readings from Last 3 Encounters:  06/19/23 216 lb 6 oz (98.1 kg)  05/06/23 214 lb 12 oz (97.4 kg)  09/08/22 210 lb 12 oz (95.6 kg)      Physical Exam Constitutional:      General: She is not in acute distress.    Appearance: Normal appearance. She is well-developed. She is not ill-appearing or toxic-appearing.  HENT:     Head: Normocephalic.     Right Ear: Hearing, tympanic membrane, ear canal and external ear normal. Tympanic membrane is not erythematous, retracted or bulging.     Left Ear: Hearing, tympanic membrane, ear canal and external ear  normal. Tympanic membrane is not erythematous, retracted or bulging.     Nose: No mucosal edema or rhinorrhea.     Right Sinus: No maxillary sinus tenderness or frontal sinus tenderness.     Left Sinus: No maxillary sinus tenderness or frontal sinus tenderness.     Mouth/Throat:     Pharynx: Uvula midline.  Eyes:     General: Lids are normal. Lids are everted, no foreign bodies appreciated.     Conjunctiva/sclera: Conjunctivae normal.     Pupils: Pupils are equal, round, and reactive to light.  Neck:     Thyroid: No thyroid mass or thyromegaly.     Vascular: No carotid bruit.     Trachea: Trachea normal.  Cardiovascular:     Rate and Rhythm: Normal rate and regular rhythm.     Pulses: Normal pulses.     Heart sounds: Normal heart sounds, S1 normal and S2 normal. No murmur heard.    No friction rub. No gallop.  Pulmonary:     Effort: Pulmonary effort is normal. No tachypnea or respiratory distress.     Breath sounds: Normal breath sounds. No decreased breath sounds, wheezing, rhonchi or rales.  Abdominal:     General: Bowel sounds are normal.     Palpations: Abdomen is soft.     Tenderness: There is no abdominal tenderness.  Musculoskeletal:     Cervical back: Normal range of motion and neck supple.  Skin:    General: Skin is warm and dry.     Findings: No rash.  Neurological:     Mental Status: She is alert.  Psychiatric:        Mood and Affect: Mood is not anxious or depressed.        Speech: Speech normal.        Behavior: Behavior normal. Behavior is cooperative.        Thought Content: Thought content normal.        Judgment: Judgment normal.       Results for orders placed or performed in visit on 01/10/22  Urine Culture   Collection Time: 01/10/22 10:33 AM   Specimen: Urine   Urine  Result Value Ref Range   Urine Culture, Routine Final report    Organism ID, Bacteria Comment   POCT Urinalysis Dipstick   Collection Time: 01/10/22 11:48 AM  Result Value Ref  Range   Color, UA yellow    Clarity, UA     Glucose, UA Negative Negative   Bilirubin, UA negative    Ketones, UA negative    Spec Grav, UA <=1.005 (A) 1.010 - 1.025  Blood, UA trace-intact    pH, UA 7.0 5.0 - 8.0   Protein, UA Negative Negative   Urobilinogen, UA 0.2 0.2 or 1.0 E.U./dL   Nitrite, UA negative    Leukocytes, UA Negative Negative   Appearance     Odor      Assessment and Plan  BRBPR (bright red blood per rectum) Assessment & Plan:  After explaining the procedure, informed consent was obtained verbally  Using the anoscope instrument, anoscopy was carried out.  Proceeded to 2-3 cm.  Findings: internal hemorrhoids noted, procedure well tolerated without complications. No mass.  Visualization somewhat obscured by stool in rectal vault.. soft.  No complications were encountered;  the procedure was well  tolerated.    ASSESSMENT:  Norma anoscopy, non bleeding  hemorrhoids.    Will treat with rectal suppository hydrocortisone 25 mg per rectum daily x 3 to 5 days. Continue treating constipation, chronic with MiraLAX daily, fiber and increase water.  If symptoms continuing recommended reevaluation and consideration of return to GI.   Internal hemorrhoids  Other orders -     Hydrocortisone Acetate; Place 1 suppository (25 mg total) rectally daily.  Dispense: 6 suppository; Refill: 0    No follow-ups on file.   Kerby Nora, MD

## 2023-06-19 NOTE — Assessment & Plan Note (Addendum)
  After explaining the procedure, informed consent was obtained verbally  Using the anoscope instrument, anoscopy was carried out.  Proceeded to 2-3 cm.  Findings: internal hemorrhoids noted, procedure well tolerated without complications. No mass.  Visualization somewhat obscured by stool in rectal vault.. soft.  No complications were encountered;  the procedure was well  tolerated.    ASSESSMENT:  Norma anoscopy, non bleeding  hemorrhoids.    Will treat with rectal suppository hydrocortisone 25 mg per rectum daily x 3 to 5 days. Continue treating constipation, chronic with MiraLAX daily, fiber and increase water.  If symptoms continuing recommended reevaluation and consideration of return to GI.

## 2023-08-13 ENCOUNTER — Ambulatory Visit: Admitting: Family Medicine

## 2023-08-13 ENCOUNTER — Ambulatory Visit: Payer: Self-pay | Admitting: *Deleted

## 2023-08-13 ENCOUNTER — Encounter: Payer: Self-pay | Admitting: Family Medicine

## 2023-08-13 VITALS — BP 129/88 | HR 75 | Ht 63.5 in | Wt 205.0 lb

## 2023-08-13 DIAGNOSIS — R42 Dizziness and giddiness: Secondary | ICD-10-CM | POA: Diagnosis not present

## 2023-08-13 DIAGNOSIS — F419 Anxiety disorder, unspecified: Secondary | ICD-10-CM

## 2023-08-13 DIAGNOSIS — J3089 Other allergic rhinitis: Secondary | ICD-10-CM

## 2023-08-13 DIAGNOSIS — F324 Major depressive disorder, single episode, in partial remission: Secondary | ICD-10-CM

## 2023-08-13 MED ORDER — FLUOXETINE HCL 20 MG PO TABS: 20.0000 mg | ORAL_TABLET | Freq: Every day | ORAL | 1 refills | Status: DC

## 2023-08-13 MED ORDER — MONTELUKAST SODIUM 10 MG PO TABS: 10.0000 mg | ORAL_TABLET | Freq: Every day | ORAL | 1 refills | Status: AC

## 2023-08-13 MED ORDER — MECLIZINE HCL 25 MG PO TABS: 25.0000 mg | ORAL_TABLET | Freq: Three times a day (TID) | ORAL | 0 refills | Status: AC | PRN

## 2023-08-13 MED ORDER — FEXOFENADINE HCL 180 MG PO TABS: 180.0000 mg | ORAL_TABLET | Freq: Every day | ORAL | 1 refills | Status: AC

## 2023-08-13 MED ORDER — BUSPIRONE HCL 10 MG PO TABS: ORAL_TABLET | ORAL | 1 refills | Status: AC

## 2023-08-13 NOTE — Patient Instructions (Signed)
 It was nice to see you today,  We addressed the following topics today: -I am sending in meclizine  for you to use as needed for dizziness. - I am sending in refills of your other medications. - If your symptoms are getting worse or any of the symptoms are concerning for a stroke, just go to one of the emergency departments  Have a great day,  Etha Henle, MD

## 2023-08-13 NOTE — Telephone Encounter (Signed)
 Per Dr Arabella Beach we scheduled her for the 4:10 slot this afternoon.Aaron Aas

## 2023-08-13 NOTE — Telephone Encounter (Signed)
  Chief Complaint: dizziness s/p URI hx of dizziness requiring physical therapy in the past Symptoms: dizziness this am mild headache, reports BP WNL but did not give value. Able to work at school. Does not feel safe to drive. Feels woozy standing but able to walk without falling. Drinking plenty of water throughout day  Frequency: this am  Pertinent Negatives: Patient denies chest pain no difficulty breathing no fever no blurred vision no weakness on either side of body reported.  Disposition: [] ED /[x] Urgent Care (no appt availability in office) / [] Appointment(In office/virtual)/ []  [facility]/ [] Home Care/ [] Refused Recommended Disposition /[] [facility]/ []  Follow-up with PCP Additional Notes:    No available appt today or tomorrow. Recommended UC and patient reports due to financial strain she does not want to go to UC or ED or med centers. "That is why I have a Dr." Patient will be leaving work today by 11 am . Patient would like to know if she can be worked in or on wait list for today . If needed to call patient before 11 am please call #[phone]. After 11 am please call patient  #[phone] CAL aware patient requesting appt today       Copied from CRM [id number]. Topic: Clinical - Red Word Triage >> Aug 13, 2023  9:25 AM Crispin Dolphin wrote: Red Word that prompted transfer to Nurse Triage: Dizziness and not feeling herself Reason for Disposition  [1] MODERATE dizziness (e.g., interferes with normal activities) AND [2] has NOT been evaluated by doctor (or NP/PA) for this  (Exception: Dizziness caused by heat exposure, sudden standing, or poor fluid intake.)  Answer Assessment - Initial Assessment Questions 1. DESCRIPTION: "Describe your dizziness."     Lightheaded standing  2. LIGHTHEADED: "Do you feel lightheaded?" (e.g., somewhat faint, woozy, weak upon standing)     Lightheaded standing  3. VERTIGO: "Do you feel like either you or the room is spinning or  tilting?" (i.e. vertigo)     Na  4. SEVERITY: "How bad is it?"  "Do you feel like you are going to faint?" "Can you stand and walk?"   - MILD: Feels slightly dizzy, but walking normally.   - MODERATE: Feels unsteady when walking, but not falling; interferes with normal activities (e.g., school, work).   - SEVERE: Unable to walk without falling, or requires assistance to walk without falling; feels like passing out now.      Moderate not comfortable to drive  5. ONSET:  "When did the dizziness begin?"     This am  6. AGGRAVATING FACTORS: "Does anything make it worse?" (e.g., standing, change in head position)     Standing  7. HEART RATE: "Can you tell me your heart rate?" "How many beats in 15 seconds?"  (Note: not all patients can do this)       Na  8. CAUSE: "What do you think is causing the dizziness?"     Not sure  9. RECURRENT SYMPTOM: "Have you had dizziness before?" If Yes, ask: "When was the last time?" "What happened that time?"     Years ago  10. OTHER SYMPTOMS: "Do you have any other symptoms?" (e.g., fever, chest pain, vomiting, diarrhea, bleeding)       S/p URI last week , mild headache  11. PREGNANCY: "Is there any chance you are pregnant?" "When was your last menstrual period?"       na  Protocols used: Dizziness - Lightheadedness-A-AH

## 2023-08-13 NOTE — Progress Notes (Unsigned)
   Acute Office Visit  Subjective:     Patient ID: Paige Alvarez, female    DOB: May 25, 1965, 58 y.o.   MRN: [id number]  Chief Complaint  Patient presents with   Dizziness    HPI Patient is in today for ***    ROS      Objective:    BP 129/88   Pulse 75   Ht 5' 3.5" (1.613 m)   Wt 205 lb (93 kg)   SpO2 95%   BMI 35.74 kg/m  {Vitals History (Optional):23777}  Physical Exam  No results found for any visits on 08/13/23.      Assessment & Plan:   Anxiety  Major depressive disorder with single episode, in partial remission (HCC)  Environmental and seasonal allergies     No follow-ups on file.  Laneta Pintos, MD

## 2023-08-17 DIAGNOSIS — R42 Dizziness and giddiness: Secondary | ICD-10-CM | POA: Insufficient documentation

## 2023-08-17 NOTE — Assessment & Plan Note (Signed)
 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

## 2023-10-08 ENCOUNTER — Other Ambulatory Visit: Payer: Self-pay | Admitting: Family Medicine

## 2023-10-08 DIAGNOSIS — Z1231 Encounter for screening mammogram for malignant neoplasm of breast: Secondary | ICD-10-CM

## 2023-10-19 ENCOUNTER — Ambulatory Visit: Payer: Self-pay

## 2023-10-19 NOTE — Telephone Encounter (Signed)
 FYI Only or Action Required?: Action required by provider: request for appointment.  Patient was last seen in primary care on 08/13/2023 by Chandra Toribio POUR, MD.  Called Nurse Triage reporting Generalized Body Aches.  Symptoms began a week ago.  Interventions attempted: OTC medications: Advil.  Symptoms are: gradually worsening.  Triage Disposition: See PCP When Office is Open (Within 3 Days)  Patient/caregiver understands and will follow disposition?: Yes                                    Reason for Triage: Arms and legs aching for a week, no appt soon enough  Best contact: [phone]  -----------------------------------------------------------------------  From previous Reason for Contact - Scheduling:  Patient/patient representative is calling to schedule an appointment. Refer to attachments for appointment information.  Reason for Disposition  [1] MODERATE pain (e.g., interferes with normal activities) AND [2] present > 3 days  Answer Assessment - Initial Assessment Questions 1. ONSET: When did the muscle aches or body pains start?      A week ago  2. LOCATION: What part of your body is hurting? (e.g., entire body, arms, legs)      Arms and legs 3. SEVERITY: How bad is the pain? (Scale 1-10; or mild, moderate, severe)     Rates pain a 5 or 6 at rest, describes pain as a dull ache 4. CAUSE: What do you think is causing the pains?     Possibly low potassium 5. FEVER: Do you have a fever? If Yes, ask: What is your temperature, how was it measured, and  when did it start?      Denies 6. OTHER SYMPTOMS: Do you have any other symptoms? (e.g., chest pain, cold or flu symptoms, rash, weakness, weight loss)     Slight leg and foot cramps about 4 days ago, denies sickness, denies rash, denies redness, denies numbness, denies weakness    Advised patient to be seen within 3 days. No availability in office until 12/08/23. Patient  expressed frustration and inquired if she should consider changing PCPs. Please advise. Patient would like a call back tomorrow.  Protocols used: Muscle Aches and Body Pain-A-AH

## 2023-10-20 NOTE — Telephone Encounter (Signed)
 Contacted pt and appt scheduled.

## 2023-10-21 ENCOUNTER — Ambulatory Visit

## 2023-10-27 ENCOUNTER — Ambulatory Visit
Admission: RE | Admit: 2023-10-27 | Discharge: 2023-10-27 | Disposition: A | Discharge status: Home / Self Care | Source: Ambulatory Visit | Attending: Family Medicine | Admitting: Family Medicine

## 2023-10-27 DIAGNOSIS — Z1231 Encounter for screening mammogram for malignant neoplasm of breast: Secondary | ICD-10-CM

## 2024-01-25 ENCOUNTER — Encounter: Payer: Self-pay | Admitting: Radiology

## 2024-03-15 ENCOUNTER — Other Ambulatory Visit: Payer: Self-pay | Admitting: Family Medicine

## 2024-03-15 DIAGNOSIS — J3089 Other allergic rhinitis: Secondary | ICD-10-CM

## 2024-03-21 ENCOUNTER — Other Ambulatory Visit: Payer: Self-pay | Admitting: Family Medicine

## 2024-03-21 DIAGNOSIS — F419 Anxiety disorder, unspecified: Secondary | ICD-10-CM

## 2024-03-21 DIAGNOSIS — F324 Major depressive disorder, single episode, in partial remission: Secondary | ICD-10-CM
